# Patient Record
Sex: Female | Born: 1960 | Race: White | Hispanic: No | Smoking: Never smoker
Health system: Southern US, Community
[De-identification: ages and names within clinical notes are randomized; demographics above are authoritative.]

## PROBLEM LIST (undated history)

## (undated) DIAGNOSIS — F329 Major depressive disorder, single episode, unspecified: Secondary | ICD-10-CM

## (undated) DIAGNOSIS — I499 Cardiac arrhythmia, unspecified: Secondary | ICD-10-CM

## (undated) DIAGNOSIS — I509 Heart failure, unspecified: Secondary | ICD-10-CM

## (undated) DIAGNOSIS — E039 Hypothyroidism, unspecified: Secondary | ICD-10-CM

## (undated) DIAGNOSIS — I341 Nonrheumatic mitral (valve) prolapse: Secondary | ICD-10-CM

## (undated) DIAGNOSIS — I10 Essential (primary) hypertension: Secondary | ICD-10-CM

## (undated) DIAGNOSIS — F32A Depression, unspecified: Secondary | ICD-10-CM

## (undated) DIAGNOSIS — F419 Anxiety disorder, unspecified: Secondary | ICD-10-CM

## (undated) DIAGNOSIS — R011 Cardiac murmur, unspecified: Secondary | ICD-10-CM

## (undated) DIAGNOSIS — I447 Left bundle-branch block, unspecified: Secondary | ICD-10-CM

## (undated) DIAGNOSIS — N200 Calculus of kidney: Secondary | ICD-10-CM

## (undated) DIAGNOSIS — E282 Polycystic ovarian syndrome: Secondary | ICD-10-CM

## (undated) HISTORY — PX: TONSILLECTOMY: SUR1361

## (undated) HISTORY — PX: LITHOTRIPSY: SUR834

## (undated) HISTORY — PX: HERNIA REPAIR: SHX51

## (undated) HISTORY — PX: DILATION AND CURETTAGE OF UTERUS: SHX78

## (undated) HISTORY — PX: CHOLECYSTECTOMY: SHX55

---

## 2008-03-19 ENCOUNTER — Encounter: Admission: RE | Admit: 2008-03-19 | Discharge: 2008-03-19 | Payer: Self-pay | Admitting: Endocrinology

## 2009-05-25 ENCOUNTER — Emergency Department (HOSPITAL_COMMUNITY): Admission: EM | Admit: 2009-05-25 | Discharge: 2009-05-25 | Payer: Self-pay | Admitting: Emergency Medicine

## 2009-07-18 ENCOUNTER — Ambulatory Visit (HOSPITAL_COMMUNITY): Admission: RE | Admit: 2009-07-18 | Discharge: 2009-07-18 | Payer: Self-pay | Admitting: Endocrinology

## 2010-02-21 ENCOUNTER — Encounter: Admission: RE | Admit: 2010-02-21 | Discharge: 2010-02-21 | Payer: Self-pay | Admitting: Gynecology

## 2010-02-28 ENCOUNTER — Encounter: Admission: RE | Admit: 2010-02-28 | Discharge: 2010-02-28 | Payer: Self-pay | Admitting: Gynecology

## 2010-12-21 ENCOUNTER — Encounter: Payer: Self-pay | Admitting: Gynecology

## 2011-01-30 ENCOUNTER — Other Ambulatory Visit: Payer: Self-pay | Admitting: Endocrinology

## 2011-01-30 DIAGNOSIS — R1011 Right upper quadrant pain: Secondary | ICD-10-CM

## 2011-02-01 ENCOUNTER — Ambulatory Visit (HOSPITAL_COMMUNITY)
Admission: RE | Admit: 2011-02-01 | Discharge: 2011-02-01 | Disposition: A | Discharge status: Home / Self Care | Payer: 59 | Source: Ambulatory Visit | Attending: Endocrinology | Admitting: Endocrinology

## 2011-02-01 DIAGNOSIS — R112 Nausea with vomiting, unspecified: Secondary | ICD-10-CM | POA: Insufficient documentation

## 2011-02-01 DIAGNOSIS — R1011 Right upper quadrant pain: Secondary | ICD-10-CM | POA: Insufficient documentation

## 2011-02-01 DIAGNOSIS — K8020 Calculus of gallbladder without cholecystitis without obstruction: Secondary | ICD-10-CM | POA: Insufficient documentation

## 2011-02-25 ENCOUNTER — Other Ambulatory Visit: Payer: Self-pay | Admitting: Gynecology

## 2011-02-25 DIAGNOSIS — Z1231 Encounter for screening mammogram for malignant neoplasm of breast: Secondary | ICD-10-CM

## 2011-03-09 LAB — DIFFERENTIAL
Basophils Absolute: 0 10*3/uL (ref 0.0–0.1)
Eosinophils Absolute: 0.1 10*3/uL (ref 0.0–0.7)
Eosinophils Relative: 0 % (ref 0–5)
Lymphocytes Relative: 12 % (ref 12–46)
Lymphs Abs: 1.7 10*3/uL (ref 0.7–4.0)
Monocytes Absolute: 1.1 10*3/uL — ABNORMAL HIGH (ref 0.1–1.0)

## 2011-03-09 LAB — CBC
HCT: 34.4 % — ABNORMAL LOW (ref 36.0–46.0)
MCV: 84.4 fL (ref 78.0–100.0)
Platelets: 311 10*3/uL (ref 150–400)
RDW: 13.5 % (ref 11.5–15.5)

## 2011-03-09 LAB — URINALYSIS, ROUTINE W REFLEX MICROSCOPIC
Glucose, UA: NEGATIVE mg/dL
Nitrite: NEGATIVE
Protein, ur: 30 mg/dL — AB
pH: 6 (ref 5.0–8.0)

## 2011-03-09 LAB — URINE CULTURE

## 2011-03-09 LAB — URINE MICROSCOPIC-ADD ON

## 2011-03-09 LAB — COMPREHENSIVE METABOLIC PANEL
Albumin: 2.7 g/dL — ABNORMAL LOW (ref 3.5–5.2)
Calcium: 8.5 mg/dL (ref 8.4–10.5)
Chloride: 99 mEq/L (ref 96–112)
Creatinine, Ser: 0.68 mg/dL (ref 0.4–1.2)
Potassium: 3.4 mEq/L — ABNORMAL LOW (ref 3.5–5.1)
Total Bilirubin: 0.3 mg/dL (ref 0.3–1.2)

## 2011-03-13 ENCOUNTER — Ambulatory Visit
Admission: RE | Admit: 2011-03-13 | Discharge: 2011-03-13 | Disposition: A | Discharge status: Home / Self Care | Payer: 59 | Source: Ambulatory Visit | Attending: Gynecology | Admitting: Gynecology

## 2011-03-13 DIAGNOSIS — Z1231 Encounter for screening mammogram for malignant neoplasm of breast: Secondary | ICD-10-CM

## 2011-03-17 ENCOUNTER — Other Ambulatory Visit: Payer: Self-pay | Admitting: Gynecology

## 2011-03-17 DIAGNOSIS — R928 Other abnormal and inconclusive findings on diagnostic imaging of breast: Secondary | ICD-10-CM

## 2011-04-03 ENCOUNTER — Ambulatory Visit
Admission: RE | Admit: 2011-04-03 | Discharge: 2011-04-03 | Disposition: A | Discharge status: Home / Self Care | Payer: 59 | Source: Ambulatory Visit | Attending: Gynecology | Admitting: Gynecology

## 2011-04-03 ENCOUNTER — Encounter (HOSPITAL_COMMUNITY)
Admission: RE | Admit: 2011-04-03 | Discharge: 2011-04-03 | Disposition: A | Discharge status: Home / Self Care | Payer: 59 | Source: Ambulatory Visit | Attending: Surgery | Admitting: Surgery

## 2011-04-03 DIAGNOSIS — R928 Other abnormal and inconclusive findings on diagnostic imaging of breast: Secondary | ICD-10-CM

## 2011-04-03 LAB — URINALYSIS, ROUTINE W REFLEX MICROSCOPIC
Bilirubin Urine: NEGATIVE
Ketones, ur: 15 mg/dL — AB
Protein, ur: NEGATIVE mg/dL
Specific Gravity, Urine: 1.027 (ref 1.005–1.030)
Urobilinogen, UA: 0.2 mg/dL (ref 0.0–1.0)
pH: 5 (ref 5.0–8.0)

## 2011-04-03 LAB — COMPREHENSIVE METABOLIC PANEL
Albumin: 3.1 g/dL — ABNORMAL LOW (ref 3.5–5.2)
BUN: 14 mg/dL (ref 6–23)
Potassium: 3.5 mEq/L (ref 3.5–5.1)
Total Protein: 7.1 g/dL (ref 6.0–8.3)

## 2011-04-03 LAB — CBC
MCHC: 33.6 g/dL (ref 30.0–36.0)
Platelets: 331 10*3/uL (ref 150–400)
RDW: 14.2 % (ref 11.5–15.5)

## 2011-04-03 LAB — URINE MICROSCOPIC-ADD ON

## 2011-04-03 LAB — HCG, SERUM, QUALITATIVE: Preg, Serum: NEGATIVE

## 2011-04-03 LAB — LIPASE, BLOOD: Lipase: 40 U/L (ref 11–59)

## 2011-04-06 ENCOUNTER — Ambulatory Visit (HOSPITAL_COMMUNITY): Payer: 59

## 2011-04-06 ENCOUNTER — Other Ambulatory Visit: Payer: Self-pay | Admitting: Surgery

## 2011-04-06 ENCOUNTER — Ambulatory Visit (HOSPITAL_COMMUNITY)
Admission: RE | Admit: 2011-04-06 | Discharge: 2011-04-06 | Disposition: A | Discharge status: Home / Self Care | Payer: 59 | Source: Ambulatory Visit | Attending: Surgery | Admitting: Surgery

## 2011-04-06 ENCOUNTER — Encounter (INDEPENDENT_AMBULATORY_CARE_PROVIDER_SITE_OTHER): Payer: Self-pay | Admitting: Surgery

## 2011-04-06 DIAGNOSIS — Z01810 Encounter for preprocedural cardiovascular examination: Secondary | ICD-10-CM | POA: Insufficient documentation

## 2011-04-06 DIAGNOSIS — E119 Type 2 diabetes mellitus without complications: Secondary | ICD-10-CM | POA: Insufficient documentation

## 2011-04-06 DIAGNOSIS — K8010 Calculus of gallbladder with chronic cholecystitis without obstruction: Secondary | ICD-10-CM | POA: Insufficient documentation

## 2011-04-06 DIAGNOSIS — I059 Rheumatic mitral valve disease, unspecified: Secondary | ICD-10-CM | POA: Insufficient documentation

## 2011-04-06 DIAGNOSIS — Z01812 Encounter for preprocedural laboratory examination: Secondary | ICD-10-CM | POA: Insufficient documentation

## 2011-04-06 LAB — GLUCOSE, CAPILLARY
Glucose-Capillary: 216 mg/dL — ABNORMAL HIGH (ref 70–99)
Glucose-Capillary: 183 mg/dL — ABNORMAL HIGH (ref 70–99)

## 2011-04-09 NOTE — Op Note (Signed)
NAMECALLIE, [name]                ACCOUNT NO.:  [id number]  MEDICAL RECORD NO.:  [id number]           PATIENT TYPE:  O  LOCATION:  [facility]                         FACILITY:  [facility]  PHYSICIAN:  Currie Paris, M.D.DATE OF BIRTH:  August 04, 1961  DATE OF PROCEDURE:  04/06/2011 DATE OF DISCHARGE:                              OPERATIVE REPORT   PREOPERATIVE DIAGNOSIS:  Chronic calculous cholecystitis.  POSTOPERATIVE DIAGNOSIS:  Chronic calculous cholecystitis.  PROCEDURE:  Laparoscopic cholecystectomy with cholangiogram.  SURGEON:  Currie Paris, MD  ASSISTANT:  Gabrielle Dare. Janee Morn, MD  ANESTHESIA:  General endotracheal.  CLINICAL HISTORY:  This is a 50 year old lady with known gallstone and biliary symptoms.  After discussion with the patient, she elected to proceed to cholecystectomy.  DESCRIPTION OF PROCEDURE:  I saw the patient in the holding area, she had no further questions.  We confirmed the procedure as noted above.  The patient was taken to the operating room and after satisfactory general anesthesia had been obtained the abdomen was prepped and draped and the time-out was done.  I used 0.25% plain Marcaine for each incision.  The umbilical incision was made, the fascia was identified, and the peroneal cavity entered under direct vision.  This pursestring was placed and Hasson introduced.  Of note was the fact that the patient was concerned she might have recurrent umbilical hernia which she had had repaired initially at age 56.  However, no hernia was detected on entering the abdomen.  The abdomen was insufflated at 15 and the patient tilted to the left and reverse Trendelenburg.  Under direct vision, a 10/11 trocar was placed in the epigastrium and two 5s laterally.  The gallbladder was visualized and there were no adhesions.  It was retracted over the liver.  There was a fair amount of fatty tissue around the cystic duct area, but I was able to clear  those off and free up the gallbladder anteriorly and posteriorly, make a nice large window, see a long segment of cystic artery and cystic duct with a "critical view."  I put a clip on the artery and one of the cystic duct by the gallbladder.  The cystic duct was opened and a Cook catheter was placed percutaneously for operative angiography.  Cholangiogram was normal, although there did appear to be an aberrant right hepatic duct coming into the common duct just above the cystic duct.  The catheter was removed and I clearly identified what we had already had the cystic duct freed up to be sure we did not injury the hepatic duct with those on the cholangiogram and I put 3 clips on the cystic duct and divided it.  Cystic artery had 2 additional clips placed and it was well up on the artery and it was divided.  A posterior branch coming up was also clipped and divided.  The gallbladder was removed from below to above.  It was very thin walled and there was still some bile which was irrigated out at the end of the case.  Once we had the gallbladder disconnected, I placed it in a bag.  We used  about a liter and half of saline to irrigate it and make sure there was no bleeding, bile leaks, or residual bile in the abdomen.  The gallbladder was then removed through the umbilical port after placing it in a bag.  We re-insufflated it and made a final check and then removed the lateral ports.  The umbilical site was closed with a pursestring and appeared to close well.  The abdomen was deflated through the epigastric port.  Skin was closed with 4-0 Monocryl subcuticular plus Dermabond.  The patient tolerated the procedure well.  There were no complications. All counts were correct.     Currie Paris, M.D.     CJS/MEDQ  D:  04/06/2011  T:  04/06/2011  Job:  [id number]  Electronically Signed by Cyndia Bent M.D. on 04/09/2011 05:27:36 PM

## 2011-07-22 ENCOUNTER — Other Ambulatory Visit: Payer: Self-pay | Admitting: Gynecology

## 2011-10-09 ENCOUNTER — Other Ambulatory Visit: Payer: Self-pay | Admitting: Gynecology

## 2011-10-09 DIAGNOSIS — N63 Unspecified lump in unspecified breast: Secondary | ICD-10-CM

## 2011-10-19 ENCOUNTER — Other Ambulatory Visit: Payer: Self-pay | Admitting: Gynecology

## 2011-10-19 ENCOUNTER — Ambulatory Visit
Admission: RE | Admit: 2011-10-19 | Discharge: 2011-10-19 | Disposition: A | Discharge status: Home / Self Care | Payer: 59 | Source: Ambulatory Visit | Attending: Gynecology | Admitting: Gynecology

## 2011-10-19 DIAGNOSIS — N63 Unspecified lump in unspecified breast: Secondary | ICD-10-CM

## 2011-10-26 ENCOUNTER — Other Ambulatory Visit: Payer: Self-pay | Admitting: Gynecology

## 2011-10-26 DIAGNOSIS — N63 Unspecified lump in unspecified breast: Secondary | ICD-10-CM

## 2011-10-28 ENCOUNTER — Ambulatory Visit (HOSPITAL_COMMUNITY)
Admission: RE | Admit: 2011-10-28 | Discharge: 2011-10-28 | Disposition: A | Discharge status: Home / Self Care | Payer: 59 | Source: Ambulatory Visit | Attending: Gynecology | Admitting: Gynecology

## 2011-10-28 DIAGNOSIS — R928 Other abnormal and inconclusive findings on diagnostic imaging of breast: Secondary | ICD-10-CM | POA: Insufficient documentation

## 2011-10-28 DIAGNOSIS — N63 Unspecified lump in unspecified breast: Secondary | ICD-10-CM | POA: Insufficient documentation

## 2011-10-28 DIAGNOSIS — N6009 Solitary cyst of unspecified breast: Secondary | ICD-10-CM | POA: Insufficient documentation

## 2011-10-28 MED ORDER — GADOBENATE DIMEGLUMINE 529 MG/ML IV SOLN
20.0000 mL | Freq: Once | INTRAVENOUS | Status: AC | PRN
  Administered 2011-10-28: 20 mL via INTRAVENOUS

## 2011-10-30 ENCOUNTER — Other Ambulatory Visit: Payer: Self-pay | Admitting: Gynecology

## 2011-10-30 DIAGNOSIS — R928 Other abnormal and inconclusive findings on diagnostic imaging of breast: Secondary | ICD-10-CM

## 2011-11-04 ENCOUNTER — Other Ambulatory Visit: Payer: Self-pay | Admitting: Diagnostic Radiology

## 2011-11-05 ENCOUNTER — Ambulatory Visit
Admission: RE | Admit: 2011-11-05 | Discharge: 2011-11-05 | Disposition: A | Discharge status: Home / Self Care | Payer: 59 | Source: Ambulatory Visit | Attending: Gynecology | Admitting: Gynecology

## 2011-11-05 ENCOUNTER — Other Ambulatory Visit: Payer: Self-pay | Admitting: Diagnostic Radiology

## 2011-11-05 DIAGNOSIS — N63 Unspecified lump in unspecified breast: Secondary | ICD-10-CM

## 2011-11-05 DIAGNOSIS — R928 Other abnormal and inconclusive findings on diagnostic imaging of breast: Secondary | ICD-10-CM

## 2011-11-05 MED ORDER — GADOBENATE DIMEGLUMINE 529 MG/ML IV SOLN: 20.0000 mL | Freq: Once | INTRAVENOUS | Status: DC | PRN

## 2012-01-18 ENCOUNTER — Ambulatory Visit: Payer: 59

## 2012-02-20 ENCOUNTER — Encounter (HOSPITAL_COMMUNITY): Payer: Self-pay | Admitting: Emergency Medicine

## 2012-02-20 ENCOUNTER — Other Ambulatory Visit: Payer: Self-pay

## 2012-02-20 ENCOUNTER — Inpatient Hospital Stay (HOSPITAL_COMMUNITY)
Admission: EM | Admit: 2012-02-20 | Discharge: 2012-02-23 | DRG: 291 | Disposition: A | Discharge status: Home / Self Care | Payer: 59 | Attending: Internal Medicine | Admitting: Internal Medicine

## 2012-02-20 ENCOUNTER — Emergency Department (HOSPITAL_COMMUNITY): Payer: 59

## 2012-02-20 ENCOUNTER — Emergency Department (HOSPITAL_COMMUNITY)
Admission: EM | Admit: 2012-02-20 | Discharge: 2012-02-20 | Disposition: A | Discharge status: Home / Self Care | Payer: 59 | Source: Home / Self Care | Attending: Emergency Medicine | Admitting: Emergency Medicine

## 2012-02-20 DIAGNOSIS — I059 Rheumatic mitral valve disease, unspecified: Secondary | ICD-10-CM | POA: Diagnosis present

## 2012-02-20 DIAGNOSIS — F329 Major depressive disorder, single episode, unspecified: Secondary | ICD-10-CM | POA: Diagnosis present

## 2012-02-20 DIAGNOSIS — R0989 Other specified symptoms and signs involving the circulatory and respiratory systems: Secondary | ICD-10-CM

## 2012-02-20 DIAGNOSIS — I5021 Acute systolic (congestive) heart failure: Principal | ICD-10-CM

## 2012-02-20 DIAGNOSIS — E669 Obesity, unspecified: Secondary | ICD-10-CM

## 2012-02-20 DIAGNOSIS — I509 Heart failure, unspecified: Secondary | ICD-10-CM | POA: Diagnosis present

## 2012-02-20 DIAGNOSIS — E119 Type 2 diabetes mellitus without complications: Secondary | ICD-10-CM

## 2012-02-20 DIAGNOSIS — Z6833 Body mass index (BMI) 33.0-33.9, adult: Secondary | ICD-10-CM

## 2012-02-20 DIAGNOSIS — R0600 Dyspnea, unspecified: Secondary | ICD-10-CM

## 2012-02-20 DIAGNOSIS — R000 Tachycardia, unspecified: Secondary | ICD-10-CM

## 2012-02-20 DIAGNOSIS — F411 Generalized anxiety disorder: Secondary | ICD-10-CM | POA: Diagnosis present

## 2012-02-20 DIAGNOSIS — Z886 Allergy status to analgesic agent status: Secondary | ICD-10-CM

## 2012-02-20 DIAGNOSIS — Z888 Allergy status to other drugs, medicaments and biological substances status: Secondary | ICD-10-CM

## 2012-02-20 DIAGNOSIS — I447 Left bundle-branch block, unspecified: Secondary | ICD-10-CM | POA: Diagnosis present

## 2012-02-20 DIAGNOSIS — F3289 Other specified depressive episodes: Secondary | ICD-10-CM | POA: Diagnosis present

## 2012-02-20 DIAGNOSIS — J9600 Acute respiratory failure, unspecified whether with hypoxia or hypercapnia: Secondary | ICD-10-CM

## 2012-02-20 DIAGNOSIS — I428 Other cardiomyopathies: Secondary | ICD-10-CM | POA: Diagnosis present

## 2012-02-20 HISTORY — DX: Nonrheumatic mitral (valve) prolapse: I34.1

## 2012-02-20 HISTORY — DX: Polycystic ovarian syndrome: E28.2

## 2012-02-20 HISTORY — DX: Calculus of kidney: N20.0

## 2012-02-20 LAB — CBC
HCT: 36.7 % (ref 36.0–46.0)
Hemoglobin: 12.4 g/dL (ref 12.0–15.0)
MCH: 28.6 pg (ref 26.0–34.0)
MCHC: 33.8 g/dL (ref 30.0–36.0)
MCV: 84.6 fL (ref 78.0–100.0)
Platelets: 331 10*3/uL (ref 150–400)
RBC: 4.34 MIL/uL (ref 3.87–5.11)
RDW: 14.3 % (ref 11.5–15.5)
WBC: 9.4 10*3/uL (ref 4.0–10.5)

## 2012-02-20 LAB — RAPID URINE DRUG SCREEN, HOSP PERFORMED
Barbiturates: NOT DETECTED
Benzodiazepines: NOT DETECTED
Cocaine: NOT DETECTED
Tetrahydrocannabinol: NOT DETECTED

## 2012-02-20 LAB — DIFFERENTIAL
Eosinophils Absolute: 0.1 10*3/uL (ref 0.0–0.7)
Eosinophils Relative: 1 % (ref 0–5)
Lymphs Abs: 2.5 10*3/uL (ref 0.7–4.0)
Monocytes Absolute: 0.3 10*3/uL (ref 0.1–1.0)
Monocytes Relative: 4 % (ref 3–12)

## 2012-02-20 LAB — BASIC METABOLIC PANEL
BUN: 11 mg/dL (ref 6–23)
Calcium: 8.8 mg/dL (ref 8.4–10.5)
GFR calc non Af Amer: >90 mL/min (ref >90)
Glucose, Bld: 115 mg/dL — ABNORMAL HIGH (ref 70–99)

## 2012-02-20 LAB — CK TOTAL AND CKMB (NOT AT ARMC): Relative Index: 1.6 (ref 0.0–2.5)

## 2012-02-20 LAB — POCT I-STAT TROPONIN I: Troponin i, poc: 0.02 ng/mL (ref 0.00–0.08)

## 2012-02-20 MED ORDER — FUROSEMIDE 10 MG/ML IJ SOLN
40.0000 mg | Freq: Once | INTRAMUSCULAR | Status: AC
  Administered 2012-02-20: 40 mg via INTRAVENOUS
  Filled 2012-02-20: qty 4

## 2012-02-20 MED ORDER — IOHEXOL 350 MG/ML SOLN
100.0000 mL | Freq: Once | INTRAVENOUS | Status: AC | PRN
  Administered 2012-02-20: 100 mL via INTRAVENOUS

## 2012-02-20 MED ORDER — POTASSIUM CHLORIDE CRYS ER 20 MEQ PO TBCR
20.0000 meq | EXTENDED_RELEASE_TABLET | Freq: Two times a day (BID) | ORAL | Status: DC
  Administered 2012-02-21 – 2012-02-23 (×6): 20 meq via ORAL
  Filled 2012-02-20 (×7): qty 1

## 2012-02-20 MED ORDER — ALBUTEROL SULFATE (5 MG/ML) 0.5% IN NEBU
2.5000 mg | INHALATION_SOLUTION | Freq: Once | RESPIRATORY_TRACT | Status: AC
  Administered 2012-02-20: 2.5 mg via RESPIRATORY_TRACT

## 2012-02-20 MED ORDER — ALBUTEROL SULFATE (5 MG/ML) 0.5% IN NEBU
INHALATION_SOLUTION | RESPIRATORY_TRACT | Status: AC
  Filled 2012-02-20: qty 0.5

## 2012-02-20 MED ORDER — IPRATROPIUM BROMIDE 0.02 % IN SOLN
0.5000 mg | Freq: Once | RESPIRATORY_TRACT | Status: AC
  Administered 2012-02-20: 0.5 mg via RESPIRATORY_TRACT

## 2012-02-20 MED ORDER — ASPIRIN 325 MG PO TABS
325.0000 mg | ORAL_TABLET | Freq: Once | ORAL | Status: AC
  Administered 2012-02-20: 325 mg via ORAL
  Filled 2012-02-20: qty 1

## 2012-02-20 NOTE — ED Notes (Signed)
Cardiology at bedside.

## 2012-02-20 NOTE — ED Notes (Signed)
PA-C in room now

## 2012-02-20 NOTE — ED Provider Notes (Addendum)
History     CSN: [id number]  Arrival date & time 02/20/12  1122   First MD Initiated Contact with Patient 02/20/12 1124      Chief Complaint  Patient presents with  . URI    (Consider location/radiation/quality/duration/timing/severity/associated sxs/prior treatment) HPI Comments: Patient presents urgent care today complaining of an ongoing "shortness of breath" for about a week and getting worse. She feels very fatigued and tired and seemed to get short of breath by walking short distances which is unusual for her. Patient also described have heard some wheezing and coarse sounds but has not been expressing any consistent respiratory symptoms such as, congestion, no runny nose, no fevers.   Should also denies any gastrointestinal symptoms such as nausea or vomiting.  Patient is a 51 y.o. female presenting with URI. The history is provided by the patient. No language interpreter was used.  URI The primary symptoms include fatigue and nausea. Primary symptoms do not include fever, headaches or vomiting. The current episode started more than 1 week ago. This is a new problem.  The illness is not associated with chills, plugged ear sensation, facial pain, sinus pressure, congestion or rhinorrhea.    Past Medical History  Diagnosis Date  . Diabetes mellitus   . PCOS (polycystic ovarian syndrome)   . Mitral valve prolapse   . Kidney calculi     Past Surgical History  Procedure Date  . Hernia repair   . Cholecystectomy   . Lithotripsy     Family History  Problem Relation Age of Onset  . Arthritis Mother     RA  . Diabetes Father     Type II  . Hypertension Father     History  Substance Use Topics  . Smoking status: Never Smoker   . Smokeless tobacco: Not on file  . Alcohol Use: No    OB History    Grav Para Term Preterm Abortions TAB SAB Ect Mult Living                  Review of Systems  Constitutional: Positive for fatigue. Negative for fever, chills and  appetite change.  HENT: Negative for congestion, rhinorrhea and sinus pressure.   Respiratory: Positive for chest tightness and shortness of breath.   Cardiovascular: Negative for palpitations and leg swelling.  Gastrointestinal: Positive for nausea. Negative for vomiting.  Musculoskeletal: Negative for joint swelling.  Neurological: Negative for tremors, syncope, weakness, light-headedness and headaches.    Allergies  Demerol; Indocin; Lisinopril; Lortab; Percocet; and Vicodin  Home Medications   Current Outpatient Rx  Name Route Sig Dispense Refill  . COLESEVELAM HCL 625 MG PO TABS Oral Take 625 mg by mouth 2 (two) times daily with a meal.    . NUVARING VA Vaginal Place vaginally every 30 (thirty) days.      Marland Kitchen FLUOXETINE HCL 10 MG PO CAPS Oral Take 10 mg by mouth daily as needed. For anxiety    . IBUPROFEN 800 MG PO TABS Oral Take 800 mg by mouth every 8 (eight) hours as needed. For pain    . METFORMIN HCL ER (MOD) 1000 MG PO TB24 Oral Take 1,000 mg by mouth daily with breakfast.      . SAXAGLIPTIN-METFORMIN ER 2.03-999 MG PO TB24 Oral Take 1 tablet by mouth 2 (two) times daily.    . SERTRALINE HCL 25 MG PO TABS Oral Take 25 mg by mouth daily.      BP 151/80  Pulse 118  Temp(Src) 98.5  F (36.9 C) (Oral)  Resp 20  SpO2 97%  LMP 01/20/2012  Physical Exam  Nursing note and vitals reviewed. Constitutional: She appears well-developed and well-nourished. No distress.  HENT:  Head: Normocephalic.  Eyes: Conjunctivae are normal.  Neck: Neck supple. No JVD present.  Cardiovascular: Regular rhythm.   No extrasystoles are present. Tachycardia present.  Exam reveals no gallop, no S3, no distant heart sounds and no friction rub.   No murmur heard.  No systolic murmur is present   No diastolic murmur is present  Pulmonary/Chest: Effort normal. No accessory muscle usage. No apnea and not tachypneic. No respiratory distress. She has decreased breath sounds. She has no wheezes. She has  no rhonchi. She has no rales.  Lymphadenopathy:    She has no cervical adenopathy.    ED Course  Procedures (including critical care time)  Labs Reviewed - No data to display No results found.   No diagnosis found.    MDM  Patient chief complaint is dyspnea. On review of systems patient denies having had any recent respiratory symptoms to suggest a respiratory acute or subacute process. Patient remained somewhat tachycardic during exam it was later confirmed with EKG with some abnormalities some T. way insertion was noted in lead 3 were not present on EKG on May 2012. Given patient's symptomatology have recommended to further evaluate her dyspnea in the emergency department to exclude cardiogenic source.  Patient seemed to have improve her dyspnea with a challenge bronchodilator treatment prior nebulizer treatment patient was tachycardia ranging from 110-120.        Jimmie Molly, MD 02/20/12 1259  Jimmie Molly, MD 02/20/12 1435

## 2012-02-20 NOTE — ED Notes (Signed)
Cardiology at bedside. Pt eating food plate. Pt hr at 136. MD aware.

## 2012-02-20 NOTE — ED Notes (Signed)
Treatment in progress 

## 2012-02-20 NOTE — H&P (Signed)
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   Denies h/o known HF or CAD. Told she had MVP about 20 years ago. Also had PVCs. Has been under a lot of stress recently with her husband dying in the cath lab during an MI and her son getting married. Over past week or two has noticed progressive wheezing and DOE. Used a family member's inhaler with mild benefit. Dyspnea getting worse so came to ER. In ER found to be in sinus tach with HRs 130-140 range. BNP 726,  Chest CT. No PE. +HF.  We did bedside echo. LVEF ~25% with global HK. RV OK. Trivial MR.   Denies CP, recent fevers/chills, drug use ETOH, palpitations or syncope. No h/o thyroid disease or severe HTN.   Review of Systems:     Cardiac Review of Systems: {Y] = yes [ ]  = no  Chest Pain [    ]  Resting SOB Cove.Etienne   ] Exertional SOB  [ y ]  Pollyann Kennedy Cove.Etienne  ]   Pedal Edema [   ]    Palpitations [  ] Syncope  [  ]   Presyncope [   ]  General Review of Systems: [Y] = yes [  ]=no Constitional: recent weight change [  ]; anorexia [  ]; fatigue Cove.Etienne  ]; nausea [  ]; night sweats [  ]; fever [  ]; or chills [  ];                                                                                                                                          Dental: poor dentition[  ];   Eye : blurred vision [  ]; diplopia [   ]; vision changes [  ];  Amaurosis fugax[  ]; Resp: cough [  ];  wheezing[  ];  hemoptysis[  ]; shortness of breath[ y ]; paroxysmal nocturnal dyspnea[ y ]; dyspnea on exertion[y ]; or orthopnea[y  ];  GI:  gallstones[  ], vomiting[  ];  dysphagia[  ]; melena[  ];  hematochezia [  ]; heartburn[  ];   Hx of  Colonoscopy[  ]; GU: kidney stones [  ]; hematuria[  ];   dysuria [  ];  nocturia[  ];  history of     obstruction [  ];                 Skin: rash, swelling[  ];, hair loss[  ];  peripheral edema[  ];  or itching[   ]; Musculosketetal: myalgias[  ];  joint swelling[  ];  joint erythema[  ];  joint pain[  ];  back pain[  ];  Heme/Lymph: bruising[  ];  bleeding[  ];  anemia[  ];  Neuro:  TIA[  ];  headaches[  ];  stroke[  ];  vertigo[  ];  seizures[  ];   paresthesias[  ];  difficulty walking[  ];  Psych:depression[y  ]; anxiety[ y ];  Endocrine: diabetes[ y ];  thyroid dysfunction[  ];  Immunizations: Flu [  ]; Pneumococcal[  ];  Other:  Past Medical History  Diagnosis Date  . Diabetes mellitus   . PCOS (polycystic ovarian syndrome)   . Mitral valve prolapse   . Kidney calculi     Medications Prior to Admission  Medication Dose Route Frequency Provider Last Rate Last Dose  . albuterol (PROVENTIL) (5 MG/ML) 0.5% nebulizer solution 2.5 mg  2.5 mg Nebulization Once Jimmie Molly, MD   2.5 mg at 02/20/12 1315  . aspirin tablet 325 mg  325 mg Oral Once Cyndra Numbers, MD   325 mg at 02/20/12 1657  . furosemide (LASIX) injection 40 mg  40 mg Intravenous Once [employer], PA-C   40 mg at 02/20/12 2008  . iohexol (OMNIPAQUE) 350 MG/ML injection 100 mL  100 mL Intravenous Once PRN Medication Radiologist, MD   100 mL at 02/20/12 1752  . ipratropium (ATROVENT) nebulizer solution 0.5 mg  0.5 mg Nebulization Once Jimmie Molly, MD   0.5 mg at 02/20/12 1315   Medications Prior to Admission  Medication Sig Dispense Refill  . Etonogestrel-Ethinyl Estradiol (NUVARING VA) Place vaginally every 30 (thirty) days.        Marland Kitchen FLUoxetine (PROZAC) 10 MG capsule Take 10 mg by mouth daily as needed. For anxiety      . ibuprofen (ADVIL,MOTRIN) 800 MG tablet Take 800 mg by mouth every 8 (eight) hours as needed. For pain      . metFORMIN (GLUMETZA) 1000 MG (MOD) 24 hr tablet Take 1,000 mg by mouth daily with breakfast.           Allergies  Allergen Reactions  . Demerol     vomiting  . Indocin     High BP & CBG  . Lisinopril     LOw BP  . Lortab   . Percocet (Oxycodone-Acetaminophen)     itching  . Vicodin  (Hydrocodone-Acetaminophen)     History   Social History  . Marital Status: Widowed    Spouse Name: N/A    Number of Children: N/A  . Years of Education: N/A   Occupational History  . Not on file.   Social History Main Topics  . Smoking status: Never Smoker   . Smokeless tobacco: Not on file  . Alcohol Use: No  . Drug Use: No  . Sexually Active: Not on file   Other Topics Concern  . Not on file   Social History Narrative  . No narrative on file    Family History  Problem Relation Age of Onset  . Arthritis Mother     RA  . Diabetes Father     Type II  . Hypertension Father     PHYSICAL EXAM: Filed Vitals:   02/20/12 2215  BP: 136/76  Pulse: 129  Temp:   Resp: 12   General:  Well appearing. Sitting up in bed. No current respiratory difficulty HEENT: normal Neck: supple. JVP hard to see. Appears up to jaw.. Carotids 2+ bilat; no bruits. No lymphadenopathy or thryomegaly appreciated. Cor: PMI palpable Tachycardic Regular rate & rhythm. +s3 Lungs: clear Abdomen: obese soft, nontender, nondistended. No hepatosplenomegaly. No bruits or masses. Good bowel sounds. Extremities: no cyanosis, clubbing, rash, edema Neuro:  alert & oriented x 3, cranial nerves grossly intact. moves all 4 extremities w/o difficulty. Affect pleasant.  ECG: Initial ECG Sinus tach 115 with IVCD. F/u ECG S tach 125 with LBBB (rate-related?)  Results for orders placed during the hospital encounter of 02/20/12 (from the past 24 hour(s))  CBC     Status: Normal   Collection Time   02/20/12  4:05 PM      Component Value Range   WBC 9.4  4.0 - 10.5 (K/uL)   RBC 4.34  3.87 - 5.11 (MIL/uL)   Hemoglobin 12.4  12.0 - 15.0 (g/dL)   HCT 01.0  27.2 - 53.6 (%)   MCV 84.6  78.0 - 100.0 (fL)   MCH 28.6  26.0 - 34.0 (pg)   MCHC 33.8  30.0 - 36.0 (g/dL)   RDW 64.4  03.4 - 74.2 (%)   Platelets 331  150 - 400 (K/uL)  DIFFERENTIAL     Status: Normal   Collection Time   02/20/12  4:05 PM      Component  Value Range   Neutrophils Relative 69  43 - 77 (%)   Neutro Abs 6.5  1.7 - 7.7 (K/uL)   Lymphocytes Relative 26  12 - 46 (%)   Lymphs Abs 2.5  0.7 - 4.0 (K/uL)   Monocytes Relative 4  3 - 12 (%)   Monocytes Absolute 0.3  0.1 - 1.0 (K/uL)   Eosinophils Relative 1  0 - 5 (%)   Eosinophils Absolute 0.1  0.0 - 0.7 (K/uL)   Basophils Relative 0  0 - 1 (%)   Basophils Absolute 0.0  0.0 - 0.1 (K/uL)  BASIC METABOLIC PANEL     Status: Abnormal   Collection Time   02/20/12  4:05 PM      Component Value Range   Sodium 138  135 - 145 (mEq/L)   Potassium 3.8  3.5 - 5.1 (mEq/L)   Chloride 103  96 - 112 (mEq/L)   CO2 22  19 - 32 (mEq/L)   Glucose, Bld 115 (*) 70 - 99 (mg/dL)   BUN 11  6 - 23 (mg/dL)   Creatinine, Ser 5.95  0.50 - 1.10 (mg/dL)   Calcium 8.8  8.4 - 63.8 (mg/dL)   GFR calc non Af Amer >90  >90 (mL/min)   GFR calc Af Amer >90  >90 (mL/min)  D-DIMER, QUANTITATIVE     Status: Abnormal   Collection Time   02/20/12  4:05 PM      Component Value Range   D-Dimer, Quant 1.04 (*) 0.00 - 0.48 (ug/mL-FEU)  POCT I-STAT TROPONIN I     Status: Normal   Collection Time   02/20/12  4:22 PM      Component Value Range   Troponin i, poc 0.02  0.00 - 0.08 (ng/mL)   Comment 3           MAGNESIUM     Status: Normal   Collection Time   02/20/12  4:35 PM      Component Value Range   Magnesium 1.9  1.5 - 2.5 (mg/dL)  URINE RAPID DRUG SCREEN (HOSP PERFORMED)     Status: Normal   Collection Time   02/20/12  5:54 PM      Component Value Range   Opiates NONE DETECTED  NONE DETECTED    Cocaine NONE DETECTED  NONE DETECTED    Benzodiazepines NONE DETECTED  NONE DETECTED    Amphetamines NONE DETECTED  NONE DETECTED  Tetrahydrocannabinol NONE DETECTED  NONE DETECTED    Barbiturates NONE DETECTED  NONE DETECTED   PRO B NATRIURETIC PEPTIDE     Status: Abnormal   Collection Time   02/20/12  6:36 PM      Component Value Range   Pro B Natriuretic peptide (BNP) 726.8 (*) 0 - 125 (pg/mL)  CK TOTAL AND  CKMB     Status: Normal   Collection Time   02/20/12  6:55 PM      Component Value Range   Total CK 105  7 - 177 (U/L)   CK, MB 1.7  0.3 - 4.0 (ng/mL)   Relative Index 1.6  0.0 - 2.5    Dg Chest 2 View  02/20/2012  *RADIOLOGY REPORT*  Clinical Data: Shortness of breath and wheezing.  CHEST - 2 VIEW  Comparison: None.  Findings: There is cardiomegaly without pulmonary edema.  No focal airspace disease or pleural effusion.  Convex left thoracolumbar scoliosis noted.  IMPRESSION: Cardiomegaly without acute disease.  Original Report Authenticated By: Bernadene Bell. Maricela Curet, M.D.   Ct Angio Chest W/cm &/or Wo Cm  02/20/2012  *RADIOLOGY REPORT*  Clinical Data: Short of breath.  Elevated D-dimer.  Clinical suspicion for pulmonary embolism.  CT ANGIOGRAPHY CHEST  Technique:  Multidetector CT imaging of the chest using the standard protocol during bolus administration of intravenous contrast. Multiplanar reconstructed images including MIPs were obtained and reviewed to evaluate the vascular anatomy.  Contrast: OMNIPAQUE IOHEXOL 350 MG/ML IV SOLN  Comparison: None.  Findings: Satisfactory opacification of the pulmonary arteries noted, and there is no evidence of pulmonary emboli.  No evidence of thoracic aortic aneurysm or mediastinal hematoma.  No evidence of mediastinal mass or lymphadenopathy.  No adenopathy seen elsewhere within the thorax.  Mild cardiomegaly is noted.  No evidence of pleural or pericardial effusion.  No evidence of pulmonary consolidation or mass.  Diffuse pulmonary interstitial thickening and thickening of the pulmonary interlobular septi seen, consistent with interstitial edema.  IMPRESSION:  1.  No evidence of pulmonary embolism. 2.  Cardiomegaly and diffuse thickening of interstitial lung markings, consistent with mild interstitial edema/congestive heart failure.  Original Report Authenticated By: Danae Orleans, M.D.    ASSESSMENT: 1) Acute systolic HF - suspect NICM      --LVEF  approximately 25% on bedside echo 2) Acute respiratory failure - improved 3) DM2 4) Obesity 5) LBBB, new  PLAN/DISCUSSION:  Suspect she has NICM. Unclear etiology (?DM2, stress, OSA or hyperthyroidism). Currently quite tachycardic. Respiratory status much improved with 1st dose of lasix. Will start Ace-i, digoxin, low-dose spiro and IV lasix. Get echo tomorrow. Check TSH, ANA, HIV and hepatitis panels. Hopefully can start low-dose b-blocker soon. Will defer cath for now and see if she improves with medical therapy. HF team to follow. Discussed with patient and her family. Cover DM2 with SSI.   Maeve Debord,MD 11:11 PM

## 2012-02-20 NOTE — ED Notes (Signed)
Patient remains reclined, ekg is being verified/reviewed with physician.

## 2012-02-20 NOTE — ED Notes (Signed)
Pt returns from x-ray, PA at bedside, pt placed cardiac monitor, 02 2lt [place], cont. Pulse ox.

## 2012-02-20 NOTE — ED Provider Notes (Signed)
History     CSN: [id number]  Arrival date & time 02/20/12  1404   First MD Initiated Contact with Patient 02/20/12 1526      Chief Complaint  Patient presents with  . Shortness of Breath    (Consider location/radiation/quality/duration/timing/severity/associated sxs/prior treatment) HPI Comments: Patient reports that she began feeling short of breath six days ago.  Shortness of breath was associated with some wheezing.  She was seen as Urgent Care six days ago and was diagnosed with a URI and given Rx for flonase and Albuterol, which she has been using at night.  She does not feel that the Albuterol helps with her shortness of breath.  She reports that the wheezing is worse at night and then typically resolves by noon.  She denies any chest pain.  She denies any nausea or vomiting.  Denies any numbness.  No prior cardiac history aside from a MVP.  She has never had a cardiac stress test.  She does have a history of DM, but no history of HTN or Hld.  No family history of premature CAD.   She reports that she is currently on Nuva Ring.  She also reports that 7 days ago she flew back to [place] from [place].  No prior history of DVT/PE.  No swelling or pain of the lower extremities.    Patient is a 51 y.o. female presenting with shortness of breath. The history is provided by the patient.  Shortness of Breath  Associated symptoms include cough, shortness of breath and wheezing. Pertinent negatives include no chest pain and no fever.    Past Medical History  Diagnosis Date  . Diabetes mellitus   . PCOS (polycystic ovarian syndrome)   . Mitral valve prolapse   . Kidney calculi     Past Surgical History  Procedure Date  . Hernia repair   . Cholecystectomy   . Lithotripsy     Family History  Problem Relation Age of Onset  . Arthritis Mother     RA  . Diabetes Father     Type II  . Hypertension Father     History  Substance Use Topics  . Smoking status: Never Smoker   .  Smokeless tobacco: Not on file  . Alcohol Use: No    OB History    Grav Para Term Preterm Abortions TAB SAB Ect Mult Living                  Review of Systems  Constitutional: Negative for fever and chills.  Respiratory: Positive for cough, shortness of breath and wheezing. Negative for chest tightness.   Cardiovascular: Negative for chest pain.  Gastrointestinal: Negative for nausea and vomiting.  Musculoskeletal: Negative for joint swelling.  Skin: Negative for color change.  Neurological: Negative for dizziness, syncope, light-headedness and numbness.    Allergies  Demerol; Indocin; Lisinopril; Lortab; Percocet; and Vicodin  Home Medications   Current Outpatient Rx  Name Route Sig Dispense Refill  . COLESEVELAM HCL 625 MG PO TABS Oral Take 625 mg by mouth 2 (two) times daily with a meal.    . NUVARING VA Vaginal Place vaginally every 30 (thirty) days.      Marland Kitchen FLUOXETINE HCL 10 MG PO CAPS Oral Take 10 mg by mouth daily as needed. For anxiety    . IBUPROFEN 800 MG PO TABS Oral Take 800 mg by mouth every 8 (eight) hours as needed. For pain    . METFORMIN HCL ER (MOD) 1000 MG  PO TB24 Oral Take 1,000 mg by mouth daily with breakfast.      . SAXAGLIPTIN-METFORMIN ER 2.03-999 MG PO TB24 Oral Take 1 tablet by mouth 2 (two) times daily.    . SERTRALINE HCL 25 MG PO TABS Oral Take 25 mg by mouth daily.      BP 149/98  Pulse 121  Temp(Src) 97.9 F (36.6 C) (Oral)  Resp 20  SpO2 97%  LMP 01/20/2012  Physical Exam  Nursing note and vitals reviewed. Constitutional: She is oriented to person, place, and time. She appears well-developed and well-nourished. No distress.  HENT:  Head: Normocephalic and atraumatic.  Mouth/Throat: Oropharynx is clear and moist.  Neck: Normal range of motion.  Cardiovascular: Normal heart sounds.  Tachycardia present.   Pulmonary/Chest: Effort normal and breath sounds normal. No respiratory distress. She has no wheezes. She has no rales. She exhibits  no tenderness.  Abdominal: Soft. Bowel sounds are normal. She exhibits no distension and no mass. There is no tenderness.  Musculoskeletal: She exhibits no edema.       No lower extremity swelling bilaterally. Negative Homan's sign bilaterally  Neurological: She is alert and oriented to person, place, and time.  Skin: Skin is warm and dry. She is not diaphoretic. No erythema.       No erythema of the lower extremities  Psychiatric: She has a normal mood and affect.    ED Course  Procedures (including critical care time)  Labs Reviewed  BASIC METABOLIC PANEL - Abnormal; Notable for the following:    Glucose, Bld 115 (*)    All other components within normal limits  D-DIMER, QUANTITATIVE - Abnormal; Notable for the following:    D-Dimer, Quant 1.04 (*)    All other components within normal limits  CBC  DIFFERENTIAL  POCT I-STAT TROPONIN I  MAGNESIUM  URINE RAPID DRUG SCREEN (HOSP PERFORMED)   No results found.   No diagnosis found.  7:52 PM Discussed with  Cardiology.  They report that they will come see the patient in the ED.   Date: 02/22/2012  Rate: 125 bpm  Rhythm: sinus tachycardia  QRS Axis: normal  Intervals: normal  ST/T Wave abnormalities: nonspecific ST/T changes  Conduction Disutrbances:none  Narrative Interpretation: LBBB  Old EKG Reviewed: changes noted   MDM  Patient comes in today with a chief complaint of shortness of breath that has been present for the past week and tachycardia.  She reported travel from [place] one week ago.  Therefore, CTA was ordered to rule out PE.  CTA negative for PE but did show changes consistent with CHF.  Patient with BNP of 726.  No prior history of Heart Failure.  Patient given dose of IV Lasix.   Cardiology consulted and will admit patient.        Pascal Lux [place], PA-C 02/22/12 [phone]

## 2012-02-20 NOTE — ED Notes (Signed)
Pt heart rate 130's post treatment dr Ladon Applebaum notified - pt tearful - hold transfer at this time

## 2012-02-20 NOTE — ED Provider Notes (Signed)
Patient is a Engineer, civil (consulting) at [employer]. Reports one week ago she started getting shortness of breath and having dyspnea on exertion with wheezing. She denies respiratory symptoms, fever, chest pain. She also has not had swelling of her legs.  Pt is alert and cooperative. No pitting of edema in legs. Lungs show diminished breath sounds, no rales or wheezing at this time.  Hx, EKG and labs c/w cardiomyopathy.   Plan admission  Medical screening examination/treatment/procedure(s) were conducted as a shared visit with non-physician practitioner(s) and myself.  I personally evaluated the patient during the encounter Devoria Albe, MD, Franz Dell, MD 02/20/12 [phone]

## 2012-02-20 NOTE — ED Provider Notes (Signed)
Patient is a 51 year old female who presents today after being seen in urgent care for ongoing difficulties with wheezing. Patient had no history of this or shortness of breath until the past week or so. She denies chest pain at this time. There were reportedly EKG changes at urgent care. Patient is chest pain-free at this time. She denies cough or fevers. In recent other medical visits she's been treated with Flonase and albuterol which has not helped her symptoms. She was given an albuterol treatment this morning. She does describe feeling like her heart is racing at this time.  Cardiovascular: Tachycardic with regular rhythm. No murmurs rubs or gallops. Pulmonary: Equal breath sounds bilaterally. No wheezes appreciated. Slightly diminished.  Assessment: 51 year old female who presents today with ongoing difficulties with shortness of breath and reported wheezing.  Plan: Laboratory workup as well as imaging ordered. This does include troponin as well as d-dimer. Patient did have aspirin ordered here given her report of EKG changes. EKG has been ordered as well.  Cyndra Numbers, MD 02/20/12 [phone]

## 2012-02-20 NOTE — ED Notes (Signed)
Pt ambulates to bathroom. States feeling much better. Waiting for room assignment.

## 2012-02-20 NOTE — ED Notes (Signed)
Error. Wrong pt. Discharged. Need for discharge dose not apply at this time.

## 2012-02-20 NOTE — ED Notes (Signed)
Pt ambulates to restroom c/o of exertional sob. spo2 93% on RA

## 2012-02-20 NOTE — ED Notes (Addendum)
Onset last Saturday, woke with wheezing ( never wheezed before).  Patient was out of town [place] Bane), returned home Koleen Nimrod) on Saturday night.  Patient was in [place] 3 days, patient flew on this trip.  Patient notices wheezing at the end of the day.  Reports wheezing all night. Reports wheezing in am, by afternoon feels better.  Patient has also spent time in [place] this past week.  Seen by an urgent care in [place], dx with allergic rhinitis, treated with flonase and albuteral.  Denies fever .  Denies runny nose.  Denies ear or sore throat pain.  Wheezing is intermittent, unassociated.  Breathing worsens with activity.

## 2012-02-20 NOTE — ED Notes (Signed)
Patient is very emotional, anxious.  Patient's spouse died suddenly in 08-19-11.

## 2012-02-20 NOTE — ED Notes (Signed)
Pt. Stated, I'm here from [facility] I'm to have an Echo per UC Dr.  Peggye Form been having SOB and wheezing for a week.

## 2012-02-20 NOTE — ED Notes (Addendum)
DR.I.Knapp to eval ecg.

## 2012-02-20 NOTE — ED Notes (Signed)
Heart rate 117 - 118 to ed via shuttle

## 2012-02-21 ENCOUNTER — Encounter (HOSPITAL_COMMUNITY): Payer: Self-pay

## 2012-02-21 DIAGNOSIS — I059 Rheumatic mitral valve disease, unspecified: Secondary | ICD-10-CM

## 2012-02-21 DIAGNOSIS — I5021 Acute systolic (congestive) heart failure: Principal | ICD-10-CM

## 2012-02-21 LAB — HEMOGLOBIN A1C: Mean Plasma Glucose: 134 mg/dL — ABNORMAL HIGH (ref <117)

## 2012-02-21 LAB — URINALYSIS, ROUTINE W REFLEX MICROSCOPIC
Hgb urine dipstick: NEGATIVE
Leukocytes, UA: NEGATIVE
Nitrite: NEGATIVE
Specific Gravity, Urine: 1.034 — ABNORMAL HIGH (ref 1.005–1.030)
Urobilinogen, UA: 0.2 mg/dL (ref 0.0–1.0)

## 2012-02-21 LAB — HIV ANTIBODY (ROUTINE TESTING W REFLEX): HIV: NONREACTIVE

## 2012-02-21 LAB — CARDIAC PANEL(CRET KIN+CKTOT+MB+TROPI)
CK, MB: 1.8 ng/mL (ref 0.3–4.0)
Relative Index: 1.7 (ref 0.0–2.5)
Troponin I: <0.3 ng/mL (ref <0.30)
Troponin I: <0.3 ng/mL (ref <0.30)
Troponin I: <0.3 ng/mL (ref <0.30)

## 2012-02-21 LAB — BASIC METABOLIC PANEL
BUN: 9 mg/dL (ref 6–23)
CO2: 27 mEq/L (ref 19–32)
Chloride: 101 mEq/L (ref 96–112)
Creatinine, Ser: 0.53 mg/dL (ref 0.50–1.10)
Potassium: 3.6 mEq/L (ref 3.5–5.1)

## 2012-02-21 LAB — GLUCOSE, CAPILLARY: Glucose-Capillary: 120 mg/dL — ABNORMAL HIGH (ref 70–99)

## 2012-02-21 LAB — TSH: TSH: 6.567 u[IU]/mL — ABNORMAL HIGH (ref 0.350–4.500)

## 2012-02-21 MED ORDER — SERTRALINE HCL 25 MG PO TABS
25.0000 mg | ORAL_TABLET | Freq: Every day | ORAL | Status: DC
  Administered 2012-02-21 – 2012-02-22 (×3): 25 mg via ORAL
  Filled 2012-02-21 (×4): qty 1

## 2012-02-21 MED ORDER — CAPTOPRIL 12.5 MG PO TABS
12.5000 mg | ORAL_TABLET | Freq: Three times a day (TID) | ORAL | Status: DC
  Administered 2012-02-21 – 2012-02-22 (×3): 12.5 mg via ORAL
  Filled 2012-02-21 (×6): qty 1

## 2012-02-21 MED ORDER — SODIUM CHLORIDE 0.9 % IJ SOLN: 3.0000 mL | INTRAMUSCULAR | Status: DC | PRN

## 2012-02-21 MED ORDER — FUROSEMIDE 10 MG/ML IJ SOLN
40.0000 mg | Freq: Two times a day (BID) | INTRAMUSCULAR | Status: DC
  Administered 2012-02-21 – 2012-02-22 (×3): 40 mg via INTRAVENOUS
  Filled 2012-02-21 (×5): qty 4

## 2012-02-21 MED ORDER — SERTRALINE HCL 25 MG PO TABS: 25.0000 mg | ORAL_TABLET | Freq: Every day | ORAL | Status: DC

## 2012-02-21 MED ORDER — CAPTOPRIL 6.25 MG HALF TABLET
6.2500 mg | ORAL_TABLET | Freq: Three times a day (TID) | ORAL | Status: DC
  Administered 2012-02-21: 6.25 mg via ORAL
  Filled 2012-02-21 (×4): qty 1

## 2012-02-21 MED ORDER — SPIRONOLACTONE 12.5 MG HALF TABLET
12.5000 mg | ORAL_TABLET | Freq: Every day | ORAL | Status: DC
  Administered 2012-02-21: 12.5 mg via ORAL
  Filled 2012-02-21 (×2): qty 1

## 2012-02-21 MED ORDER — INSULIN ASPART 100 UNIT/ML ~~LOC~~ SOLN
0.0000 [IU] | Freq: Three times a day (TID) | SUBCUTANEOUS | Status: DC
Start: 2012-02-21 — End: 2012-02-23
  Administered 2012-02-22: 2 [IU] via SUBCUTANEOUS
  Administered 2012-02-22: 3 [IU] via SUBCUTANEOUS
  Administered 2012-02-23: 2 [IU] via SUBCUTANEOUS

## 2012-02-21 MED ORDER — SODIUM CHLORIDE 0.9 % IV SOLN: 250.0000 mL | INTRAVENOUS | Status: DC | PRN

## 2012-02-21 MED ORDER — ACETAMINOPHEN 325 MG PO TABS
650.0000 mg | ORAL_TABLET | ORAL | Status: DC | PRN
  Administered 2012-02-21 – 2012-02-22 (×4): 650 mg via ORAL
  Filled 2012-02-21 (×4): qty 2

## 2012-02-21 MED ORDER — DIGOXIN 250 MCG PO TABS
0.2500 mg | ORAL_TABLET | Freq: Every day | ORAL | Status: DC
  Administered 2012-02-21 – 2012-02-23 (×3): 0.25 mg via ORAL
  Filled 2012-02-21 (×3): qty 1

## 2012-02-21 MED ORDER — CARVEDILOL 3.125 MG PO TABS
3.1250 mg | ORAL_TABLET | Freq: Two times a day (BID) | ORAL | Status: DC
  Administered 2012-02-21 – 2012-02-23 (×4): 3.125 mg via ORAL
  Filled 2012-02-21 (×6): qty 1

## 2012-02-21 MED ORDER — ASPIRIN EC 81 MG PO TBEC
81.0000 mg | DELAYED_RELEASE_TABLET | Freq: Every day | ORAL | Status: DC
  Administered 2012-02-21 – 2012-02-23 (×3): 81 mg via ORAL
  Filled 2012-02-21 (×3): qty 1

## 2012-02-21 MED ORDER — SODIUM CHLORIDE 0.9 % IJ SOLN
3.0000 mL | Freq: Two times a day (BID) | INTRAMUSCULAR | Status: DC
  Administered 2012-02-21 – 2012-02-22 (×5): 3 mL via INTRAVENOUS

## 2012-02-21 MED ORDER — ONDANSETRON HCL 4 MG/2ML IJ SOLN: 4.0000 mg | Freq: Four times a day (QID) | INTRAMUSCULAR | Status: DC | PRN

## 2012-02-21 NOTE — Progress Notes (Signed)
Subjective:  Feels so much better today.  No wheezing.  No dyspnea lying flat.Rhythm NSR and sinus tachycardia. 2D echo not yet done  Objective:  Vital Signs in the last 24 hours: Temp:  [97.4 F (36.3 C)-98.5 F (36.9 C)] 97.4 F (36.3 C) (03/24 0900) Pulse Rate:  [102-132] 114  (03/24 0900) Resp:  [12-25] 18  (03/24 0900) BP: (108-151)/(67-98) 111/72 mmHg (03/24 0900) SpO2:  [95 %-97 %] 96 % (03/24 0900) Weight:  [100.381 kg (221 lb 4.8 oz)] 100.381 kg (221 lb 4.8 oz) (03/24 0112)  Intake/Output from previous day: 03/23 0701 - 03/24 0700 In: 7 [I.V.:3; IV Piggyback:4] Out: 500 [Urine:500] Intake/Output from this shift: Total I/O In: 170 [P.O.:170] Out: 1026 [Urine:1025; Stool:1]     . aspirin EC  81 mg Oral Daily  . aspirin  325 mg Oral Once  . captopril  6.25 mg Oral Q8H  . digoxin  0.25 mg Oral Daily  . furosemide  40 mg Intravenous Once  . furosemide  40 mg Intravenous Q12H  . insulin aspart  0-15 Units Subcutaneous TID WC  . potassium chloride  20 mEq Oral BID  . sertraline  25 mg Oral QHS  . sodium chloride  3 mL Intravenous Q12H  . spironolactone  12.5 mg Oral Daily  . DISCONTD: sertraline  25 mg Oral Daily      Physical Exam: The patient appears to be in no distress.  Head and neck exam reveals that the pupils are equal and reactive.  The extraocular movements are full.  There is no scleral icterus.  Mouth and pharynx are benign.  No lymphadenopathy.  No carotid bruits.  The jugular venous pressure is normal.  Thyroid is not enlarged or tender.  Chest is clear to percussion and auscultation.  No rales or rhonchi.  Expansion of the chest is symmetrical.  Heart reveals no abnormal lift or heave.  First and second heart sounds are normal.  There is a prominent S3 gallop  The abdomen is soft and nontender.  Bowel sounds are normoactive.  There is no hepatosplenomegaly or mass.  There are no abdominal bruits.  Extremities reveal no phlebitis or edema.   Pedal pulses are good.  There is no cyanosis or clubbing.  Neurologic exam is normal strength and no lateralizing weakness.  No sensory deficits.  Integument reveals no rash  Lab Results:  Basename 02/20/12 1605  WBC 9.4  HGB 12.4  PLT 331    Basename 02/21/12 0510 02/20/12 1605  NA 138 138  K 3.6 3.8  CL 101 103  CO2 27 22  GLUCOSE 141* 115*  BUN 9 11  CREATININE 0.53 0.51    Basename 02/21/12 0939 02/21/12 0149  TROPONINI <0.30 <0.30   Hepatic Function Panel No results found for this basename: PROT,ALBUMIN,AST,ALT,ALKPHOS,BILITOT,BILIDIR,IBILI in the last 72 hours No results found for this basename: CHOL in the last 72 hours No results found for this basename: PROTIME in the last 72 hours  Imaging: Imaging results have been reviewed  Cardiac Studies:  Assessment/Plan:  Patient Active Hospital Problem List: Acute systolic heart failure (02/20/2012)   Assessment: Significant improvement.   Plan: Continue current meds. Add Coreg 3.125 mg BID to help tachycardia while awaiting echo Acute respiratory failure (02/20/2012)   Assessment: No wheezing.  DM2 (diabetes mellitus, type 2) (02/20/2012)   Assessment: Blood sugar high on admission.  History of diabetes   Plan: SSI ordered.    LOS: 1 day    Cassell Clement  02/21/2012, 11:07 AM

## 2012-02-21 NOTE — ED Notes (Signed)
Attempt to call report nurse not available 

## 2012-02-21 NOTE — Progress Notes (Signed)
  Echocardiogram 2D Echocardiogram has been performed.  Vanetta Rule, Real Cons 02/21/2012, 5:08 PM

## 2012-02-22 LAB — BASIC METABOLIC PANEL
BUN: 17 mg/dL (ref 6–23)
CO2: 24 mEq/L (ref 19–32)
Calcium: 9.5 mg/dL (ref 8.4–10.5)
Calcium: 9.4 mg/dL (ref 8.4–10.5)
Chloride: 102 mEq/L (ref 96–112)
Chloride: 98 mEq/L (ref 96–112)
Creatinine, Ser: 0.63 mg/dL (ref 0.50–1.10)
GFR calc Af Amer: >90 mL/min (ref >90)
Glucose, Bld: 146 mg/dL — ABNORMAL HIGH (ref 70–99)
Potassium: 4 mEq/L (ref 3.5–5.1)
Sodium: 137 mEq/L (ref 135–145)

## 2012-02-22 LAB — ANTI-NUCLEAR AB-TITER (ANA TITER): ANA Titer 1: NEGATIVE

## 2012-02-22 LAB — GLUCOSE, CAPILLARY
Glucose-Capillary: 152 mg/dL — ABNORMAL HIGH (ref 70–99)
Glucose-Capillary: 183 mg/dL — ABNORMAL HIGH (ref 70–99)
Glucose-Capillary: 151 mg/dL — ABNORMAL HIGH (ref 70–99)

## 2012-02-22 LAB — ANA: Anti Nuclear Antibody(ANA): POSITIVE — AB

## 2012-02-22 LAB — HEPATITIS PANEL, ACUTE: Hepatitis B Surface Ag: NEGATIVE

## 2012-02-22 LAB — T4, FREE: Free T4: 1.19 ng/dL (ref 0.80–1.80)

## 2012-02-22 MED ORDER — FUROSEMIDE 40 MG PO TABS
40.0000 mg | ORAL_TABLET | Freq: Every day | ORAL | Status: DC
  Administered 2012-02-23: 40 mg via ORAL
  Filled 2012-02-22: qty 1

## 2012-02-22 MED ORDER — SPIRONOLACTONE 25 MG PO TABS
25.0000 mg | ORAL_TABLET | Freq: Every day | ORAL | Status: DC
  Administered 2012-02-22 – 2012-02-23 (×2): 25 mg via ORAL
  Filled 2012-02-22 (×2): qty 1

## 2012-02-22 MED ORDER — ENALAPRIL MALEATE 10 MG PO TABS
10.0000 mg | ORAL_TABLET | Freq: Two times a day (BID) | ORAL | Status: DC
  Administered 2012-02-22 – 2012-02-23 (×2): 10 mg via ORAL
  Filled 2012-02-22 (×5): qty 1

## 2012-02-22 NOTE — Progress Notes (Signed)
02/22/12 1630 UR Completed. Tera Mater, RN, BSN Nurse Case Manager [phone]

## 2012-02-22 NOTE — Progress Notes (Signed)
Pt evaluated for progression of care as a benefit of [employer] insurance. Pt is newly active in the Link to Wellness program for DM and HTN. She is interested in cardiac rehab and can participate at the [facility] location at the time it is appropriate, as this location is closer to her home. She will receive monthly office visits with the [facility] RN Case manager as a part of the Link to Wellness program. A post d/c transition of care call will be completed and office visit scheduled at that time. Pt has contact information if any other needs arise.  Brooke Bonito C. Roena Malady, RN, CCM, [facility] Care Management Program, # [phone].

## 2012-02-22 NOTE — Progress Notes (Signed)
Subjective:   Continues to feel better. I/O negative 1.6L but weight stable. Ambulating with dyspnea. No orthopnea or PND. Formal echo not read yet. TSH mildly elevated at 6.6.    Intake/Output Summary (Last 24 hours) at 02/22/12 0802 Last data filed at 02/22/12 0715  Gross per 24 hour  Intake    721 ml  Output   2426 ml  Net  -1705 ml    Current meds:    . aspirin EC  81 mg Oral Daily  . captopril  12.5 mg Oral Q8H  . carvedilol  3.125 mg Oral BID WC  . digoxin  0.25 mg Oral Daily  . furosemide  40 mg Intravenous Q12H  . insulin aspart  0-15 Units Subcutaneous TID WC  . potassium chloride  20 mEq Oral BID  . sertraline  25 mg Oral QHS  . sodium chloride  3 mL Intravenous Q12H  . spironolactone  12.5 mg Oral Daily  . DISCONTD: captopril  6.25 mg Oral Q8H   Infusions:     Objective:  Blood pressure 121/81, pulse 92, temperature 98.7 F (37.1 C), temperature source Oral, resp. rate 18, height 5\' 8"  (1.727 m), weight 100.517 kg (221 lb 9.6 oz), last menstrual period 01/20/2012, SpO2 96.00%. Weight change: 0.136 kg (4.8 oz)  Physical Exam: General:  Well appearing. No resp difficulty HEENT: normal Neck: supple. JVP flat . Carotids 2+ bilat; no bruits. No lymphadenopathy or thryomegaly appreciated. Cor: PMI  displaced. Regular rate & rhythm. No rubs, gallops or murmurs. Lungs: clear Abdomen: soft, nontender, nondistended. No hepatosplenomegaly. No bruits or masses. Good bowel sounds. Extremities: no cyanosis, clubbing, rash, edema Neuro: alert & orientedx3, cranial nerves grossly intact. moves all 4 extremities w/o difficulty. Affect pleasant  Telemetry: Sinus 80s overnight. 120 with mild exertion.   Lab Results: Basic Metabolic Panel:  Lab 02/22/12 4098 02/21/12 0510 02/20/12 1635 02/20/12 1605  NA 137 138 -- 138  K 4.0 3.6 -- --  CL 102 101 -- 103  CO2 24 27 -- 22  GLUCOSE 146* 141* -- 115*  BUN 18 9 -- 11  CREATININE 0.63 0.53 -- 0.51  CALCIUM 9.5 9.3 --  8.8  MG -- -- 1.9 --  PHOS -- -- -- --   Liver Function Tests: No results found for this basename: AST:5,ALT:5,ALKPHOS:5,BILITOT:5,PROT:5,ALBUMIN:5 in the last 168 hours No results found for this basename: LIPASE:5,AMYLASE:5 in the last 168 hours No results found for this basename: AMMONIA:5 in the last 168 hours CBC:  Lab 02/20/12 1605  WBC 9.4  NEUTROABS 6.5  HGB 12.4  HCT 36.7  MCV 84.6  PLT 331   Cardiac Enzymes:  Lab 02/21/12 1728 02/21/12 0939 02/21/12 0149 02/20/12 1855  CKTOTAL 118 101 103 105  CKMB 1.4 1.7 1.8 1.7  CKMBINDEX -- -- -- --  TROPONINI <0.30 <0.30 <0.30 --   BNP: No components found with this basename: POCBNP:5 CBG:  Lab 02/21/12 2002 02/21/12 1714  GLUCAP 152* 120*   Microbiology: Lab Results  Component Value Date   CULT Multiple bacterial morphotypes present, none predominant. Suggest appropriate recollection if clinically indicated. 05/25/2009   No results found for this basename: CULT:2,SDES:2 in the last 168 hours  Imaging: Dg Chest 2 View  02/20/2012  *RADIOLOGY REPORT*  Clinical Data: Shortness of breath and wheezing.  CHEST - 2 VIEW  Comparison: None.  Findings: There is cardiomegaly without pulmonary edema.  No focal airspace disease or pleural effusion.  Convex left thoracolumbar scoliosis noted.  IMPRESSION: Cardiomegaly  without acute disease.  Original Report Authenticated By: Bernadene Bell. Maricela Curet, M.D.   Ct Angio Chest W/cm &/or Wo Cm  02/20/2012  *RADIOLOGY REPORT*  Clinical Data: Short of breath.  Elevated D-dimer.  Clinical suspicion for pulmonary embolism.  CT ANGIOGRAPHY CHEST  Technique:  Multidetector CT imaging of the chest using the standard protocol during bolus administration of intravenous contrast. Multiplanar reconstructed images including MIPs were obtained and reviewed to evaluate the vascular anatomy.  Contrast: OMNIPAQUE IOHEXOL 350 MG/ML IV SOLN  Comparison: None.  Findings: Satisfactory opacification of the  pulmonary arteries noted, and there is no evidence of pulmonary emboli.  No evidence of thoracic aortic aneurysm or mediastinal hematoma.  No evidence of mediastinal mass or lymphadenopathy.  No adenopathy seen elsewhere within the thorax.  Mild cardiomegaly is noted.  No evidence of pleural or pericardial effusion.  No evidence of pulmonary consolidation or mass.  Diffuse pulmonary interstitial thickening and thickening of the pulmonary interlobular septi seen, consistent with interstitial edema.  IMPRESSION:  1.  No evidence of pulmonary embolism. 2.  Cardiomegaly and diffuse thickening of interstitial lung markings, consistent with mild interstitial edema/congestive heart failure.  Original Report Authenticated By: Danae Orleans, M.D.     ASSESSMENT:  1) Acute systolic HF - suspect NICM  --LVEF approximately 25% on bedside echo  2) Acute respiratory failure - improved  3) DM2  4) Obesity  5) LBBB, new   PLAN/DISCUSSION:  Feels much better. Volume status looks good. Will consolidate meds and change lasix to po. Will review echo. Given high TSH will check T4 and T3 - may need low dose synthroid. Probable d/c in am.   LOS: 2 days    Arvilla Meres, MD 02/22/2012, 8:02 AM

## 2012-02-22 NOTE — Plan of Care (Signed)
Problem: Food- and Nutrition-Related Knowledge Deficit (NB-1.1) Goal: Nutrition education Formal process to instruct or train a patient/client in a skill or to impart knowledge to help patients/clients voluntarily manage or modify food choices and eating behavior to maintain or improve health.  Outcome: Completed/Met Date Met:  02/22/12 RD consulted for diet education. Patient requested to see RD as she is now following a low sodium diet and follows a low carb diet at home. Based on pt diet recall patient eats very few carbs and mostly fresh vegetables. RD spoke with patient about types of foods she might be eating that would be high in sodium . Pt was provided with low sodium nutrition information. Pt agreeable to reading food labels on her commonly eaten foods and making appropriate changes. Diet: Carb control with 2 gm sodium and 1800 ml fluid restriction.  Lab Results  Component Value Date    HGBA1C 6.3* 02/21/2012   No further nutrition interventions at this time.   [name]

## 2012-02-22 NOTE — ED Provider Notes (Signed)
See prior note   Ward Givens, MD 02/22/12 1024

## 2012-02-23 LAB — GLUCOSE, CAPILLARY
Glucose-Capillary: 118 mg/dL — ABNORMAL HIGH (ref 70–99)
Glucose-Capillary: 128 mg/dL — ABNORMAL HIGH (ref 70–99)

## 2012-02-23 LAB — BASIC METABOLIC PANEL
Calcium: 9.2 mg/dL (ref 8.4–10.5)
Creatinine, Ser: 0.8 mg/dL (ref 0.50–1.10)
GFR calc Af Amer: >90 mL/min (ref >90)
Sodium: 139 mEq/L (ref 135–145)

## 2012-02-23 MED ORDER — ENALAPRIL MALEATE 10 MG PO TABS: 10.0000 mg | ORAL_TABLET | Freq: Two times a day (BID) | ORAL | Status: DC

## 2012-02-23 MED ORDER — CARVEDILOL 3.125 MG PO TABS: 3.1250 mg | ORAL_TABLET | Freq: Two times a day (BID) | ORAL | Status: DC

## 2012-02-23 MED ORDER — SPIRONOLACTONE 25 MG PO TABS: 25.0000 mg | ORAL_TABLET | Freq: Every day | ORAL | Status: DC

## 2012-02-23 MED ORDER — POTASSIUM CHLORIDE CRYS ER 20 MEQ PO TBCR: 20.0000 meq | EXTENDED_RELEASE_TABLET | Freq: Every day | ORAL | Status: DC

## 2012-02-23 MED ORDER — FUROSEMIDE 40 MG PO TABS: 40.0000 mg | ORAL_TABLET | Freq: Every day | ORAL | Status: DC

## 2012-02-23 MED ORDER — DIGOXIN 250 MCG PO TABS: 0.2500 mg | ORAL_TABLET | Freq: Every day | ORAL | Status: DC

## 2012-02-23 NOTE — Discharge Summary (Signed)
Advanced Heart Failure Team  Discharge Summary   Patient ID: [name] MRN: [id number], DOB/AGE: Jun 09, 1961 51 y.o. 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  She has been under increased stress recently with her husband dying in the cath lab during an MI and her son getting married as well as studying for NP school.  She noted progressive dyspnea/wheezing over the last couple of weeks and presented to the [facility] ER.    In the ER she was found to have sinus tach with HRs ranging 130-140.  Chest CT negative for PE but notable for pulmonary edema.  ProBNP 726.  Bedside echo showed LVEF ~25% with global hypokinesis and trivial MR.  She was admitted for acute systolic heart failure and IV diuresis.  She was started on ACE-I, digoxin, and spironolactone.  She diuresed 3 liters during admission and her lasix was transitioned to oral regimen.  Etiology felt to be NICM, most likely stressed induced.  Cath was deferred at this time to see if medical therapy improved EF.  TSH elevated but Free T3 and T4 within normal limits, ANA/HIV/Hepatitis panels normal.  Formal echo revealed, Left ventricle: The cavity size was severely dilated. Wall thickness was normal. The estimated ejection fraction was LVEF 25% with diffuse hypokinesis.  There was mild MR, mildly dilated LA.   On day of discharge, the patient was ambulating down the halls without dyspnea.  She denies orthopnea/PND/CP.  Weight is down to 219 pounds.  Renal function stable.  Dr. Gala Romney felt she was stable for home with close  outpatient follow up.    Discharge Weight Range: 218-220  Discharge Vitals: Blood pressure 103/59, pulse 96, temperature 98.1 F (36.7 C), temperature source Oral, resp. rate 18, height 5\' 8"  (1.727 m), weight 99.6 kg (219 lb 9.3 oz), last menstrual period 01/20/2012, SpO2 96.00%.  Physical Exam:  General: Well appearing. No resp difficulty  HEENT: normal  Neck: supple. JVP flat . Carotids 2+ bilat; no bruits. No lymphadenopathy or thryomegaly appreciated.  Cor: PMI displaced. Regular rate & rhythm. No rubs, gallops or murmurs.  Lungs: clear  Abdomen: soft, nontender, nondistended. No hepatosplenomegaly. No bruits or masses. Good bowel sounds.  Extremities: no cyanosis, clubbing, rash, edema  Neuro: alert & orientedx3, cranial nerves grossly intact. moves all 4 extremities w/o difficulty. Affect pleasant  Labs: Lab Results  Component Value Date   WBC 9.4 02/20/2012   HGB 12.4 02/20/2012   HCT 36.7 02/20/2012   MCV 84.6 02/20/2012   PLT 331 02/20/2012     Lab 02/23/12 0525  NA 139  K 4.1  CL 102  CO2 25  BUN 22  CREATININE 0.80  CALCIUM 9.2  PROT --  BILITOT --  ALKPHOS --  ALT --  AST --  GLUCOSE 122*    Basename 02/21/12 1728 02/21/12 0939 02/21/12 0149 02/20/12 1855  CKTOTAL 118 101 103 105  CKMB 1.4 1.7 1.8 1.7  TROPONINI <0.30 <0.30 <0.30 --   No results found for this basename: CHOL,  HDL,  LDLCALC,  TRIG   BNP (last 3 results)  Basename 02/22/12 2315 02/20/12 1836  PROBNP 457.9* 726.8*    Diagnostic Studies/Procedures   Echo  02/21/12 - Left ventricle: The cavity size was severely dilated. Wall thickness was normal. The estimated ejection fraction was 25%. Diffuse hypokinesis. - Mitral valve: Mild regurgitation. - Left atrium: The atrium was mildly dilated. - Atrial septum: No defect or patent foramen ovale was identified.   Dg Chest 2 View  02/20/2012  *RADIOLOGY REPORT*  Clinical Data: Shortness of breath and wheezing.  CHEST - 2 VIEW  Comparison:  None.  Findings: There is cardiomegaly without pulmonary edema.  No focal airspace disease or pleural effusion.  Convex left thoracolumbar scoliosis noted.  IMPRESSION: Cardiomegaly without acute disease.  Original Report Authenticated By: Bernadene Bell. Maricela Curet, M.D.   Ct Angio Chest W/cm &/or Wo Cm  02/20/2012  *RADIOLOGY REPORT*  Clinical Data: Short of breath.  Elevated D-dimer.  Clinical suspicion for pulmonary embolism.  CT ANGIOGRAPHY CHEST  Technique:  Multidetector CT imaging of the chest using the standard protocol during bolus administration of intravenous contrast. Multiplanar reconstructed images including MIPs were obtained and reviewed to evaluate the vascular anatomy.  Contrast: OMNIPAQUE IOHEXOL 350 MG/ML IV SOLN  Comparison: None.  Findings: Satisfactory opacification of the pulmonary arteries noted, and there is no evidence of pulmonary emboli.  No evidence of thoracic aortic aneurysm or mediastinal hematoma.  No evidence of mediastinal mass or lymphadenopathy.  No adenopathy seen elsewhere within the thorax.  Mild cardiomegaly is noted.  No evidence of pleural or pericardial effusion.  No evidence of pulmonary consolidation or mass.  Diffuse pulmonary interstitial thickening and thickening of the pulmonary interlobular septi seen, consistent with interstitial edema.  IMPRESSION:  1.  No evidence of pulmonary embolism. 2.  Cardiomegaly and diffuse thickening of interstitial lung markings, consistent with mild interstitial edema/congestive heart failure.  Original Report Authenticated By: Danae Orleans, M.D.    Discharge Medications   Medication List  As of 02/23/2012 10:40 AM   STOP taking these medications         metFORMIN 1000 MG (MOD) 24 hr tablet         TAKE these medications         carvedilol 3.125 MG tablet   Commonly known as: COREG   Take 1 tablet (3.125 mg total) by mouth 2 (two) times daily with a meal.      colesevelam 625 MG tablet   Commonly known as:  WELCHOL   Take 625 mg by mouth 2 (two) times daily with a meal.      digoxin 0.25 MG tablet   Commonly known as: LANOXIN   Take 1 tablet (0.25 mg total) by mouth daily.      enalapril 10 MG tablet   Commonly known as: VASOTEC   Take 1 tablet (10 mg total) by mouth 2 (two) times daily.      FLUoxetine 10 MG capsule   Commonly known as: PROZAC   Take 10 mg by mouth daily as needed. For anxiety      furosemide 40 MG tablet   Commonly known as: LASIX   Take 1 tablet (40 mg total) by mouth daily. May take additional tablet daily for weight gain greater than 3 lbs in 24 hours      ibuprofen 800 MG tablet   Commonly known as: ADVIL,MOTRIN   Take 800 mg by mouth every 8 (eight) hours as needed. For  pain      NUVARING VA   Place vaginally every 30 (thirty) days.      potassium chloride SA 20 MEQ tablet   Commonly known as: K-DUR,KLOR-CON   Take 1 tablet (20 mEq total) by mouth daily. Take additional tablet with extra lasix dose.      Saxagliptin-Metformin 2.03-999 MG Tb24   Take 1 tablet by mouth 2 (two) times daily.      sertraline 25 MG tablet   Commonly known as: ZOLOFT   Take 25 mg by mouth daily.      spironolactone 25 MG tablet   Commonly known as: ALDACTONE   Take 1 tablet (25 mg total) by mouth daily.            Disposition   The patient will be discharged in stable condition to home. Discharge Orders    Future Appointments: Provider: Department: Dept Phone: Center:   03/02/2012 10:15 AM [facility] [phone] None     Future Orders Please Complete By Expires   Diet - low sodium heart healthy      Increase activity slowly      Heart Failure patients record your daily weight using the same scale at the same time of day      STOP any activity that causes chest pain, shortness of breath, dizziness, sweating, or exessive weakness      (HEART FAILURE PATIENTS) Call MD:  Anytime you have any of the following symptoms: 1) 3 pound weight gain  in 24 hours or 5 pounds in 1 week 2) shortness of breath, with or without a dry hacking cough 3) swelling in the hands, feet or stomach 4) if you have to sleep on extra pillows at night in order to breathe.      Beta Blocker already ordered      ACE Inhibitor / ARB already ordered        Follow-up Information    Follow up with Arvilla Meres, MD on 03/02/2012. (at 10:15a       [employer] Code [phone]))    Contact information:   [facility] [address] [place] [address] [phone]            Duration of Discharge Encounter: Greater than 35 minutes   Signed, Robbi Garter PA-C 02/23/2012, 10:40 AM  Patient seen and examined with Ulyess Blossom PA-C on day of discharged. We discussed all aspects of the encounter. I agree with the entire note as stated above. Will follow closely in HF clinci.  Kazuko Clemence,MD 11:35 PM

## 2012-02-23 NOTE — Progress Notes (Signed)
IV d/c'd. Tele d/c'd. Pt d/c'd to home. Home meds and d/c instructions discussed with pt. Pt denies any questions or concerns at this time. Pt leaving unit via wheelchair and appears in no acute distress. Juanetta Negash RN 

## 2012-02-23 NOTE — Discharge Instructions (Signed)
Heart Failure Heart failure (HF) is a condition in which the heart has trouble pumping blood. This means your heart does not pump blood efficiently for your body to work well. In some cases of HF, fluid may back up into your lungs or you may have swelling (edema) in your lower legs. HF is a long-term (chronic) condition. It is important for you to take good care of yourself and follow your caregiver's treatment plan. CAUSES   Health conditions:   High blood pressure (hypertension) causes the heart muscle to work harder than normal. When pressure in the blood vessels is high, the heart needs to pump (contract) with more force in order to circulate blood throughout the body. High blood pressure eventually causes the heart to become stiff and weak.   Coronary artery disease (CAD) is the buildup of cholesterol and fat (plaques) in the arteries of the heart. The blockage in the arteries deprives the heart muscle of oxygen and blood. This can cause chest pain and may lead to a heart attack. High blood pressure can also contribute to CAD.   Heart attack (myocardial infarction) occurs when 1 or more arteries in the heart become blocked. The loss of oxygen damages the muscle tissue of the heart. When this happens, part of the heart muscle dies. The injured tissue does not contract as well and weakens the heart's ability to pump blood.   Abnormal heart valves can cause HF when the heart valves do not open and close properly. This makes the heart muscle pump harder to keep the blood flowing.   Heart muscle disease (cardiomyopathy or myocarditis) is damage to the heart muscle from a variety of causes. These can include drug or alcohol abuse, infections, or unknown reasons. These can increase the risk of HF.   Lung disease makes the heart work harder because the lungs do not work properly. This can cause a strain on the heart leading it to fail.   Diabetes increases the risk of HF. High blood sugar contributes  to high fat (lipid) levels in the blood. Diabetes can also cause slow damage to tiny blood vessels that carry important nutrients to the heart muscle. When the heart does not get enough oxygen and food, it can cause the heart to become weak and stiff. This leads to a heart that does not contract efficiently.   Other diseases can contribute to HF. These include abnormal heart rhythms, thyroid problems, and low blood counts (anemia).   Unhealthy lifestyle habits:   Obesity.   Smoking.   Eating foods high in fat and cholesterol.   Eating or drinking beverages high in salt.   Drug or alcohol abuse.   Lack of exercise.  SYMPTOMS  HF symptoms may vary and can be hard to detect. Symptoms may include:  Shortness of breath with activity, such as climbing stairs.   Persistent cough.   Swelling of the feet, ankles, legs, or abdomen.   Unexplained weight gain.   Difficulty breathing when lying flat.   Waking from sleep because of the need to sit up and get more air.   Rapid heartbeat.   Fatigue and loss of energy.   Feeling lightheaded or close to fainting.  DIAGNOSIS  A diagnosis of HF is based on your history, symptoms, physical examination, and diagnostic tests. Diagnostic tests for HF may include:  EKG.   Chest X-ray.   Blood tests.   Exercise stress test.   Blood oxygen test (arterial blood gas).   Evaluation   by a heart doctor (cardiologist).   Ultrasound evaluation of the heart (echocardiogram).   Heart artery test to look for blockages (angiogram).   Radioactive imaging to look at the heart (radionuclide test).  TREATMENT  Treatment is aimed at managing the symptoms of HF. Medicines, lifestyle changes, or surgical intervention may be necessary to treat HF.  Medicines to help treat HF may include:   Angiotensin-converting enzyme (ACE) inhibitors. These block the effects of a blood protein called angiotensin-converting enzyme. ACE inhibitors relax (dilate) the  blood vessels and help lower blood pressure. This decreases the workload of the heart, slows the progression of HF, and improves symptoms.   Angiotensin receptor blockers (ARBs). These medications work similar to ACE inhibitors. ARBs may be an alternative for people who cannot tolerate an ACE inhibitor.   Aldosterone antagonists. This medication helps get rid of extra fluid from your body. This lowers the volume of blood the heart has to pump.   Water pills (diuretics). Diuretics cause the kidneys to remove salt and water from the blood. The extra fluid is removed by urination. By removing extra fluid from the body, diuretics help lower the workload of the heart and help prevent fluid buildup in the lungs so breathing is easier.   Beta blockers. These prevent the heart from beating too fast and improve heart muscle strength. Beta blockers help maintain a normal heart rate, control blood pressure, and improve HF symptoms.   Digitalis. This increases the force of the heartbeat and may be helpful to people with HF or heart rhythm problems.   Healthy lifestyle changes include:   Stopping smoking.   Eating a healthy diet. Avoid foods high in fat. Avoid foods fried in oil or made with fat. A dietician can help with healthy food choices.   Limiting how much salt you eat.   Limiting alcohol intake to no more than 1 drink per day for women and 2 drinks per day for men. Drinking more than that is harmful to your heart. If your heart has already been damaged by alcohol or you have severe HF, drinking alcohol should be stopped completely.   Exercising as directed by your caregiver.   Surgical treatment for HF may include:   Procedures to open blocked arteries, repair damaged heart valves, or remove damaged heart muscle tissue.   A pacemaker to help heart muscle function and to control certain abnormal heart rhythms.   A defibrillator to possibly prevent sudden cardiac death.  HOME CARE  INSTRUCTIONS   Activity level. Your caregiver can help you determine what type of exercise program may be helpful. It is important to maintain your strength. Pace your physical activity to avoid shortness of breath or chest pain. Rest for 1 hour before and after meals. A cardiac rehabilitation program may be helpful to some people with HF.   Diet. Eat a heart healthy diet. Food choices should be low in saturated fat and cholesterol. Talk to a dietician to learn about heart healthy foods.   Salt intake. When you have HF, you need to limit the amount of salt you eat. Eat less than 1500 milligrams (mg) of salt per day or as recommended by your caregiver.   Weight monitoring. Weigh yourself every day. You should weigh yourself in the morning after you urinate and before you eat breakfast. Wear the same amount of clothing each time you weigh yourself. Record your weight daily. Bring your recorded weights to your clinic visits. Tell your caregiver right away if   you have gained 3 lb/1.4 kg in 1 day, or 5 lb/2.3 kg in a week or whatever amount you were told to report.   Blood pressure monitoring. This should be done as directed by your caregiver. A home blood pressure cuff can be purchased at a drugstore. Record your blood pressure numbers and bring them to your clinic visits. Tell your caregiver if you become dizzy or lightheaded upon standing up.   Smoking. If you are currently a smoker, it is time to quit. Nicotine makes your heart work harder by causing your blood vessels to constrict. Do not use nicotine gum or patches before talking to your caregiver.   Follow up. Be sure to schedule a follow-up visit with your caregiver. Keep all your appointments.  SEEK MEDICAL CARE IF:   Your weight increases by 3 lb/1.4 kg in 1 day or 5 lb/2.3 kg in a week.   You notice increasing shortness of breath that is unusual for you. This may happen during rest, sleep, or with activity.   You cough more than normal,  especially with physical activity.   You notice more swelling in your hands, feet, ankles, or belly (abdomen).   You are unable to sleep because it is hard to breathe.   You cough up bloody mucus (sputum).   You begin to feel "jumping" or "fluttering" sensations (palpitations) in your chest.  SEEK IMMEDIATE MEDICAL CARE IF:   You have severe chest pain or pressure which may include symptoms such as:   Pain or pressure in the arms, neck, jaw, or back.   Feeling sweaty.   Feeling sick to your stomach (nauseous).   Feeling short of breath while at rest.   Having a fast or irregular heartbeat.   You experience stroke symptoms. These symptoms include:   Facial weakness or numbness.   Weakness or numbness in an arm, leg, or on one side of your body.   Blurred vision.   Difficulty talking or thinking.   Dizziness or fainting.   Severe headache.  THESE ARE MEDICAL EMERGENCIES. Do not wait to see if the symptoms go away. Call your local emergency services (911 in [place] DO NOT drive yourself to the hospital. IMPORTANT  Make a list of every medicine, vitamin, or herbal supplement you are taking. Keep the list with you at all times. Show it to your caregiver at every visit. Keep the list up-to-date.   Ask your caregiver or pharmacist to write an explanation of each medicine you are taking. This should include:   Why you are taking it.   The possible side effects.   The best time of day to take it.   Foods to take with it or what foods to avoid.   When to stop taking it.  MAKE SURE YOU:   Understand these instructions.   Will watch your condition.   Will get help right away if you are not doing well or get worse.  Document Released: 11/16/2005 Document Revised: 11/05/2011 Document Reviewed: 02/28/2010 [employer] Patient Information 2012 [employer]. 

## 2012-03-02 ENCOUNTER — Telehealth (HOSPITAL_COMMUNITY): Payer: Self-pay | Admitting: Vascular Surgery

## 2012-03-02 ENCOUNTER — Ambulatory Visit (HOSPITAL_COMMUNITY)
Admission: RE | Admit: 2012-03-02 | Discharge: 2012-03-02 | Disposition: A | Discharge status: Home / Self Care | Payer: 59 | Source: Ambulatory Visit | Attending: Internal Medicine | Admitting: Internal Medicine

## 2012-03-02 ENCOUNTER — Encounter (HOSPITAL_COMMUNITY): Payer: Self-pay

## 2012-03-02 VITALS — BP 118/78 | HR 83 | Wt 220.0 lb

## 2012-03-02 DIAGNOSIS — I5040 Unspecified combined systolic (congestive) and diastolic (congestive) heart failure: Secondary | ICD-10-CM | POA: Insufficient documentation

## 2012-03-02 DIAGNOSIS — I5021 Acute systolic (congestive) heart failure: Secondary | ICD-10-CM

## 2012-03-02 DIAGNOSIS — R002 Palpitations: Secondary | ICD-10-CM

## 2012-03-02 DIAGNOSIS — I5022 Chronic systolic (congestive) heart failure: Secondary | ICD-10-CM

## 2012-03-02 LAB — BASIC METABOLIC PANEL
BUN: 15 mg/dL (ref 6–23)
GFR calc Af Amer: >90 mL/min (ref >90)
GFR calc non Af Amer: >90 mL/min (ref >90)
Potassium: 4.2 mEq/L (ref 3.5–5.1)
Sodium: 135 mEq/L (ref 135–145)

## 2012-03-02 MED ORDER — CARVEDILOL 6.25 MG PO TABS: 6.2500 mg | ORAL_TABLET | Freq: Two times a day (BID) | ORAL | Status: DC

## 2012-03-02 NOTE — Progress Notes (Signed)
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 She has been under increased stress recently with her husband dying in the cath lab during an MI and her son getting married as well as studying for NP school.   She was admitted to [facility] in 01/2011 for acute systolic heart failure, presumed NICM, EF 25% (no cath or stress testing performed).  ProBNP 726.  Started on ACE-I, carvedilol, digoxin, and spironolactone. There was mild MR, mildly dilated LA.  TSH elevated but Free T3 and T4 within normal limits, ANA/HIV/Hepatitis panels normal.   She returns for post hospital follow up today. Feeling very good. No dyspnea, edema, orthopnea or PND, Taking all meds as directed. Functional capacity much improved - able to go up steps without a problem. Weight stable at home at 215. Occasional palpitations getting much less prominent. Can last 5-15 minutes. Often after eating. Takes pulse and is regular in 80s. No syncope or presyncope.    ROS: All systems negative except as listed in HPI, PMH and Problem List.  Past Medical History  Diagnosis Date  . PCOS (polycystic ovarian syndrome)   . Mitral valve prolapse   . Diabetes mellitus     since 2002, NIDDM on OHAS  . Kidney calculi     lithotripsy 20112 times    Current Outpatient Prescriptions  Medication Sig Dispense Refill  . carvedilol (COREG) 3.125 MG tablet Take 1 tablet (3.125 mg total) by mouth 2 (two) times daily with a meal.  60 tablet  3  . colesevelam (WELCHOL) 625 MG tablet Take 625 mg by mouth 2 (two) times daily with a meal.      . digoxin (LANOXIN) 0.25 MG tablet Take 1 tablet (0.25 mg total) by mouth daily.  30 tablet  6  . enalapril (VASOTEC) 10 MG tablet Take 1 tablet (10 mg total) by mouth 2 (two) times daily.  60 tablet  6  . Etonogestrel-Ethinyl Estradiol (NUVARING VA) Place  vaginally every 30 (thirty) days.        Marland Kitchen FLUoxetine (PROZAC) 10 MG capsule Take 10 mg by mouth daily as needed. For anxiety      . furosemide (LASIX) 40 MG tablet Take 1 tablet (40 mg total) by mouth daily. May take additional tablet daily for weight gain greater than 3 lbs in 24 hours  45 tablet  6  . ibuprofen (ADVIL,MOTRIN) 800 MG tablet Take 800 mg by mouth every 8 (eight) hours as needed. For pain      . potassium chloride SA (K-DUR,KLOR-CON) 20 MEQ tablet Take 1 tablet (20 mEq total) by mouth daily. Take additional tablet with extra lasix dose.  45 tablet  6  . Saxagliptin-Metformin 2.03-999 MG TB24 Take 1 tablet by mouth 2 (two) times daily.      . sertraline (ZOLOFT) 25 MG tablet Take 25 mg by mouth daily.      Marland Kitchen spironolactone (ALDACTONE) 25 MG tablet Take 1 tablet (25 mg total) by mouth daily.  30 tablet  6  . DISCONTD: metFORMIN (GLUMETZA) 1000 MG (MOD) 24 hr tablet Take 1,000 mg by mouth daily with breakfast.           PHYSICAL EXAM: Filed Vitals:   03/02/12 1021  BP: 118/78  Pulse: 83  Weight: 220 lb (99.791 kg)  SpO2: 96%    General:  Well appearing. No resp difficulty HEENT: normal Neck: supple. JVP flat. Carotids 2+ bilaterally; no bruits. No lymphadenopathy or thryomegaly appreciated. Cor: PMI normal. Regular rate & rhythm. No rubs, gallops or murmurs. Lungs: clear Abdomen: soft, nontender, nondistended. No hepatosplenomegaly. No bruits or masses. Good bowel sounds. Extremities: no cyanosis, clubbing, rash, edema Neuro: alert & orientedx3, cranial nerves grossly intact. Moves all 4 extremities w/o difficulty. Affect pleasant.    ASSESSMENT & PLAN:

## 2012-03-02 NOTE — Patient Instructions (Signed)
Increase Carvedilol 6.25 mg Twice daily   Labs today  Your physician recommends that you schedule a follow-up appointment in: 3 weeks

## 2012-03-02 NOTE — Assessment & Plan Note (Signed)
Doing very well. NYHA II. Volume status looks good. Will increase carvedilol to 6.25 bid. I suspect she has stress-induced CM and should recover relatively quickly. However, given high level of stress in her work I have asked her to remain out of work until we can titrate meds and make sure EF is substantially improved. Check labs today.

## 2012-03-02 NOTE — Telephone Encounter (Signed)
Pt called needing a note saying she is out of work. 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

## 2012-03-02 NOTE — Assessment & Plan Note (Signed)
I suspect these are atrial in nature but need to exclude significant ventricular ectopy. Place 48-hour Holter. If significant NSVT or VT will need LifeVest.

## 2012-03-03 ENCOUNTER — Encounter (HOSPITAL_COMMUNITY): Payer: Self-pay | Admitting: *Deleted

## 2012-03-03 NOTE — Telephone Encounter (Signed)
Pt aware letter was faxed.

## 2012-03-04 ENCOUNTER — Telehealth: Payer: Self-pay

## 2012-03-07 ENCOUNTER — Encounter (HOSPITAL_COMMUNITY): Payer: Self-pay | Admitting: *Deleted

## 2012-03-07 ENCOUNTER — Telehealth (HOSPITAL_COMMUNITY): Payer: Self-pay | Admitting: *Deleted

## 2012-03-07 NOTE — Telephone Encounter (Signed)
[name] called today.  She needs a letter to return to school, with no restrictions, this is for course work only. Please fax it to [phone], Attn Dr Lorin Picket. Thanks.

## 2012-03-07 NOTE — Telephone Encounter (Signed)
Letter faxed, pt aware

## 2012-03-08 ENCOUNTER — Telehealth (HOSPITAL_COMMUNITY): Payer: Self-pay | Admitting: *Deleted

## 2012-03-08 NOTE — Telephone Encounter (Signed)
Pt had dropped off form to be completed for her disability, form was completed and signed by Dr Gala Romney and faxed to [facility] at [phone]

## 2012-03-14 NOTE — Telephone Encounter (Signed)
Patient cal call back to make appt for 03/17/2012

## 2012-03-22 ENCOUNTER — Encounter (INDEPENDENT_AMBULATORY_CARE_PROVIDER_SITE_OTHER): Payer: 59

## 2012-03-22 DIAGNOSIS — R002 Palpitations: Secondary | ICD-10-CM

## 2012-03-30 ENCOUNTER — Ambulatory Visit (HOSPITAL_COMMUNITY)
Admission: RE | Admit: 2012-03-30 | Discharge: 2012-03-30 | Disposition: A | Discharge status: Home / Self Care | Payer: 59 | Source: Ambulatory Visit | Attending: Internal Medicine | Admitting: Internal Medicine

## 2012-03-30 VITALS — BP 120/70 | HR 83 | Wt 220.0 lb

## 2012-03-30 DIAGNOSIS — I5022 Chronic systolic (congestive) heart failure: Secondary | ICD-10-CM | POA: Insufficient documentation

## 2012-03-30 DIAGNOSIS — I4892 Unspecified atrial flutter: Secondary | ICD-10-CM | POA: Insufficient documentation

## 2012-03-30 DIAGNOSIS — R002 Palpitations: Secondary | ICD-10-CM | POA: Insufficient documentation

## 2012-03-30 MED ORDER — CARVEDILOL 6.25 MG PO TABS: 9.3750 mg | ORAL_TABLET | Freq: Two times a day (BID) | ORAL | Status: DC

## 2012-03-30 MED ORDER — AMIODARONE HCL 200 MG PO TABS: 200.0000 mg | ORAL_TABLET | Freq: Two times a day (BID) | ORAL | Status: DC

## 2012-03-30 MED ORDER — APIXABAN 5 MG PO TABS: 5.0000 mg | ORAL_TABLET | Freq: Two times a day (BID) | ORAL | Status: DC

## 2012-03-30 NOTE — Progress Notes (Signed)
Patient ID: [name], female   DOB: 06/27/1961, 51 y.o.   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 She has been under increased stress recently with her husband dying in the cath lab during an MI and her son getting married as well as studying for NP school.   She was admitted to [facility] in 01/2012 for acute systolic heart failure, presumed NICM, EF 25% (no cath or stress testing performed).  ProBNP 726.  Started on ACE-I, carvedilol, digoxin, and spironolactone. There was mild MR, mildly dilated LA.  TSH elevated but Free T3 and T4 within normal limits, ANA/HIV/Hepatitis panels normal.   Event monitor: 03/25/12 Frequent PVCs   A-Flutter  She returns for follow up. Last visit, carvedilol increased to 6.25 mg BID. Complains of fatigue. Intermittent dizziness at rest. Ongoing palpitations. Denies SOB/PND/Orthopnea. Weight at home 215-218. Denies lower extremity edema. She continues work on NP degree.   ROS: All systems negative except as listed in HPI, PMH and Problem List.  Past Medical History  Diagnosis Date  . PCOS (polycystic ovarian syndrome)   . Mitral valve prolapse   . Diabetes mellitus     since 2002, NIDDM on OHAS  . Kidney calculi     lithotripsy 20112 times    Current Outpatient Prescriptions  Medication Sig Dispense Refill  . ALPRAZolam (XANAX) 0.25 MG tablet Take 0.25 mg by mouth 3 (three) times daily as needed.      Marland Kitchen CALCIUM-MAGNESIUM-ZINC PO Take 1 tablet by mouth daily.      . carvedilol (COREG) 6.25 MG tablet Take 1 tablet (6.25 mg total) by mouth 2 (two) times daily with a meal.  60 tablet  6  . colesevelam (WELCHOL) 625 MG tablet Take 625 mg by mouth 2 (two) times daily with a meal.      . digoxin (LANOXIN) 0.25 MG tablet Take 1 tablet (0.25 mg total) by mouth daily.  30 tablet  6  .  enalapril (VASOTEC) 10 MG tablet Take 1 tablet (10 mg total) by mouth 2 (two) times daily.  60 tablet  6  . Etonogestrel-Ethinyl Estradiol (NUVARING VA) Place vaginally every 30 (thirty) days.        . fish oil-omega-3 fatty acids 1000 MG capsule Take 2 g by mouth daily. Krill oil capsule      . FLUoxetine (PROZAC) 10 MG capsule Take 10 mg by mouth daily as needed. For anxiety      . furosemide (LASIX) 40 MG tablet Take 1 tablet (40 mg total) by mouth daily. May take additional tablet daily for weight gain greater than 3 lbs in 24 hours  45 tablet  6  . ibuprofen (ADVIL,MOTRIN) 800 MG tablet Take 800 mg by mouth every 8 (eight) hours as needed. For pain      . Multiple Vitamin (MULTIVITAMIN) tablet Take 1 tablet by mouth daily.      . potassium chloride SA (K-DUR,KLOR-CON) 20 MEQ tablet Take 1 tablet (20 mEq total) by mouth daily. Take additional tablet with extra lasix dose.  45 tablet  6  . Saxagliptin-Metformin 2.03-999 MG TB24 Take 1 tablet by mouth 2 (two) times daily with a meal.       . sertraline (ZOLOFT) 25 MG tablet Take 25 mg by mouth daily.      Marland Kitchen  spironolactone (ALDACTONE) 25 MG tablet Take 1 tablet (25 mg total) by mouth daily.  30 tablet  6  . DISCONTD: metFORMIN (GLUMETZA) 1000 MG (MOD) 24 hr tablet Take 1,000 mg by mouth daily with breakfast.           PHYSICAL EXAM: Filed Vitals:   03/30/12 1355  BP: 120/70  Pulse: 83  Weight: 220 lb (99.791 kg)  SpO2: 99%    General:  Well appearing. No resp difficulty HEENT: normal Neck: supple. JVP flat. Carotids 2+ bilaterally; no bruits. No lymphadenopathy or thryomegaly appreciated. Cor: PMI normal. Regular rate & rhythm. No rubs, gallops or murmurs. Lungs: clear Abdomen: soft, nontender, nondistended. No hepatosplenomegaly. No bruits or masses. Good bowel sounds. Extremities: no cyanosis, clubbing, rash, edema Neuro: alert & orientedx3, cranial nerves grossly intact. Moves all 4 extremities w/o difficulty. Affect  pleasant.    ASSESSMENT & PLAN:

## 2012-03-30 NOTE — Assessment & Plan Note (Addendum)
Discussed event monitor results. Frequent PVCs and A-Flutter noted. Will refer to EP.

## 2012-03-30 NOTE — Assessment & Plan Note (Addendum)
Event monitor results reviewed with Dr Gala Romney. Frequent PVCs and A flutter noted.Will start Amiodarone 200 mg bid.    Patient seen and examined with Tonye Becket, NP. We discussed all aspects of the encounter. I agree with the assessment and plan as stated above.  Monitor shows brief runs of AFL and 1% PVCS/bigeminy. Discussed with Dr. Graciela Husbands at length. Will start amio 200 bid at least for short course and start apixiban.

## 2012-03-30 NOTE — Patient Instructions (Addendum)
Take Eliquis 5 mg BID   Start Amiodarone 200 mg BID  Repeat ECHO in 10 days with and ECHO

## 2012-03-30 NOTE — Assessment & Plan Note (Addendum)
NYHA II. Volume status stable. Follow up in 10 days with an ECHO. Consider increasing Carvedilol 9.375 mg at next visit.   Patient seen and examined with Tonye Becket, NP. We discussed all aspects of the encounter. I agree with the assessment and plan as stated above. Functional capacity and volume status improving. Will not titrate carvedilol at this point as we are stating amio. Continue current rx for now. Check echo in 2 weeks.

## 2012-04-08 ENCOUNTER — Ambulatory Visit (HOSPITAL_COMMUNITY)
Admission: RE | Admit: 2012-04-08 | Discharge: 2012-04-08 | Disposition: A | Discharge status: Home / Self Care | Payer: 59 | Source: Ambulatory Visit | Attending: Internal Medicine | Admitting: Internal Medicine

## 2012-04-08 ENCOUNTER — Ambulatory Visit (HOSPITAL_COMMUNITY)
Admission: RE | Admit: 2012-04-08 | Discharge: 2012-04-08 | Disposition: A | Discharge status: Home / Self Care | Payer: 59 | Source: Ambulatory Visit | Attending: Adult Health | Admitting: Adult Health

## 2012-04-08 VITALS — BP 118/74 | HR 86 | Wt 214.8 lb

## 2012-04-08 DIAGNOSIS — I517 Cardiomegaly: Secondary | ICD-10-CM

## 2012-04-08 DIAGNOSIS — I4892 Unspecified atrial flutter: Secondary | ICD-10-CM

## 2012-04-08 DIAGNOSIS — I5022 Chronic systolic (congestive) heart failure: Secondary | ICD-10-CM

## 2012-04-08 DIAGNOSIS — E119 Type 2 diabetes mellitus without complications: Secondary | ICD-10-CM | POA: Insufficient documentation

## 2012-04-08 DIAGNOSIS — I509 Heart failure, unspecified: Secondary | ICD-10-CM | POA: Insufficient documentation

## 2012-04-08 MED ORDER — CARVEDILOL 6.25 MG PO TABS: 9.3750 mg | ORAL_TABLET | Freq: Two times a day (BID) | ORAL | Status: DC

## 2012-04-08 NOTE — Progress Notes (Signed)
  Echocardiogram 2D Echocardiogram has been performed.  Dorena Cookey 04/08/2012, 11:29 AM

## 2012-04-08 NOTE — Assessment & Plan Note (Addendum)
Repeat EKG. NSR  76.Continue Amiodarone 200 mg twice a day

## 2012-04-08 NOTE — Progress Notes (Signed)
Patient ID: [name], female   DOB: Apr 22, 1961, 51 y.o.   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 She has been under increased stress recently with her husband dying in the cath lab during an MI and her son getting married as well as studying for NP school.   She was admitted to [facility] in 01/2012 for acute systolic heart failure, presumed NICM, EF 25% (no cath or stress testing performed).  ProBNP 726.  Started on ACE-I, carvedilol, digoxin, and spironolactone. There was mild MR, mildly dilated LA.  TSH elevated but Free T3 and T4 within normal limits, ANA/HIV/Hepatitis panels normal.   Event monitor: 03/25/12 Frequent PVCs   A-Flutter  Last visit brief runs AFL and PVCs bigeminy. Dr Gala Romney discussed with Dr Graciela Husbands and she started Amiodarone 200 mg bid.  04/08/12 ECHO EF 30-35% LVIDd 5.6 (previous LVIDd 6.2 EF 25%)  She returns for follow up. Denies SOB/ PND/Orthopnea. Decreased palpitations. SOB/PND/Orthopnea. Weight at home trending down to 211 pounds. Denies lower extremity edema. She continues to work on NP degree.   ROS: All systems negative except as listed in HPI, PMH and Problem List.  Past Medical History  Diagnosis Date  . PCOS (polycystic ovarian syndrome)   . Mitral valve prolapse   . Diabetes mellitus     since 2002, NIDDM on OHAS  . Kidney calculi     lithotripsy 20112 times    Current Outpatient Prescriptions  Medication Sig Dispense Refill  . ALPRAZolam (XANAX) 0.25 MG tablet Take 0.25 mg by mouth 3 (three) times daily as needed.      Marland Kitchen amiodarone (PACERONE) 200 MG tablet Take 1 tablet (200 mg total) by mouth 2 (two) times daily.  60 tablet  6  . apixaban (ELIQUIS) 5 MG TABS tablet Take 1 tablet (5 mg total) by mouth 2 (two) times daily.  60 tablet  2  . CALCIUM-MAGNESIUM-ZINC PO Take 1  tablet by mouth daily.      . carvedilol (COREG) 6.25 MG tablet Take 6.25 mg by mouth 2 (two) times daily with a meal.      . colesevelam (WELCHOL) 625 MG tablet Take 625 mg by mouth 2 (two) times daily with a meal.      . digoxin (LANOXIN) 0.25 MG tablet Take 1 tablet (0.25 mg total) by mouth daily.  30 tablet  6  . enalapril (VASOTEC) 10 MG tablet Take 1 tablet (10 mg total) by mouth 2 (two) times daily.  60 tablet  6  . Etonogestrel-Ethinyl Estradiol (NUVARING VA) Place vaginally every 30 (thirty) days.        . fish oil-omega-3 fatty acids 1000 MG capsule Take 2 g by mouth daily. Krill oil capsule      . furosemide (LASIX) 40 MG tablet Take 1 tablet (40 mg total) by mouth daily. May take additional tablet daily for weight gain greater than 3 lbs in 24 hours  45 tablet  6  . ibuprofen (ADVIL,MOTRIN) 800 MG tablet Take 800 mg by mouth every 8 (eight) hours as needed. For pain      . Multiple Vitamin (MULTIVITAMIN) tablet Take 1 tablet by mouth daily.      . potassium chloride SA (K-DUR,KLOR-CON) 20 MEQ tablet Take 1 tablet (20 mEq total) by mouth daily. Take additional tablet  with extra lasix dose.  45 tablet  6  . Saxagliptin-Metformin 2.03-999 MG TB24 Take 1 tablet by mouth 2 (two) times daily with a meal.       . sertraline (ZOLOFT) 25 MG tablet Take 25 mg by mouth daily.      Marland Kitchen spironolactone (ALDACTONE) 25 MG tablet Take 1 tablet (25 mg total) by mouth daily.  30 tablet  6  . DISCONTD: carvedilol (COREG) 6.25 MG tablet Take 1.5 tablets (9.375 mg total) by mouth 2 (two) times daily with a meal.  90 tablet  6  . DISCONTD: metFORMIN (GLUMETZA) 1000 MG (MOD) 24 hr tablet Take 1,000 mg by mouth daily with breakfast.           PHYSICAL EXAM: Filed Vitals:   04/08/12 1158  BP: 118/74  Pulse: 86  Weight: 214 lb 12 oz (97.41 kg)  SpO2: 96%    General:  Well appearing. No resp difficulty HEENT: normal Neck: supple. JVP flat. Carotids 2+ bilaterally; no bruits. No lymphadenopathy or  thryomegaly appreciated. Cor: PMI normal. Regular rate & rhythm. No rubs, gallops or murmurs. Lungs: clear Abdomen: soft, nontender, nondistended. No hepatosplenomegaly. No bruits or masses. Good bowel sounds. Extremities: no cyanosis, clubbing, rash, edema Neuro: alert & orientedx3, cranial nerves grossly intact. Moves all 4 extremities w/o difficulty. Affect pleasant.    ASSESSMENT & PLAN:

## 2012-04-08 NOTE — Patient Instructions (Addendum)
Take Carvedilol 9.375 twice a day  You may obtain massages  Follow up in 3 weeks  Do the following things EVERYDAY: 1) Weigh yourself in the morning before breakfast. Write it down and keep it in a log. 2) Take your medicines as prescribed 3) Eat low salt foods--Limit salt (sodium) to 2000 mg per day.  4) Stay as active as you can everyday 5) Limit all fluids for the day to less than 2 liters

## 2012-04-08 NOTE — Assessment & Plan Note (Addendum)
She returns for follow up. Repeat ECHO revealed EF 35-40% previous EF 25%.  LVIDd decreased to 5.6 from 6.2 which likely indicates reverse remodeling. Will increase carvedilol 9.375 mg twice a day. Discussed potential side effects such as fatigue and edema. Follow up in 2 months with an ECHO. She will continue stay out of work and will readdress at next office visit.   Patient seen and examined with Tonye Becket, NP. We discussed all aspects of the encounter. I agree with the assessment and plan as stated above. We reviewed echo together. EF improving, less dilated. Symptomatically better. Suspect she will make full recovery. Continue to titrate meds. Volume status looks good on exam.

## 2012-04-12 ENCOUNTER — Telehealth (HOSPITAL_COMMUNITY): Payer: Self-pay | Admitting: *Deleted

## 2012-04-12 MED ORDER — FUROSEMIDE 40 MG PO TABS: 40.0000 mg | ORAL_TABLET | Freq: Every day | ORAL | Status: DC

## 2012-04-12 MED ORDER — AMIODARONE HCL 200 MG PO TABS: 200.0000 mg | ORAL_TABLET | Freq: Two times a day (BID) | ORAL | Status: DC

## 2012-04-12 MED ORDER — CARVEDILOL 6.25 MG PO TABS: 9.3750 mg | ORAL_TABLET | Freq: Two times a day (BID) | ORAL | Status: DC

## 2012-04-12 MED ORDER — SPIRONOLACTONE 25 MG PO TABS: 25.0000 mg | ORAL_TABLET | Freq: Every day | ORAL | Status: DC

## 2012-04-12 MED ORDER — APIXABAN 5 MG PO TABS: 5.0000 mg | ORAL_TABLET | Freq: Two times a day (BID) | ORAL | Status: DC

## 2012-04-12 MED ORDER — DIGOXIN 250 MCG PO TABS: 0.2500 mg | ORAL_TABLET | Freq: Every day | ORAL | Status: DC

## 2012-04-12 MED ORDER — POTASSIUM CHLORIDE CRYS ER 20 MEQ PO TBCR: 20.0000 meq | EXTENDED_RELEASE_TABLET | Freq: Every day | ORAL | Status: DC

## 2012-04-12 MED ORDER — ENALAPRIL MALEATE 10 MG PO TABS: 10.0000 mg | ORAL_TABLET | Freq: Two times a day (BID) | ORAL | Status: DC

## 2012-04-12 NOTE — Telephone Encounter (Signed)
[name] [place] the [place] and does not have her medications with her. She needs a 5 day supply of the following meds, coreg, digoxin, lasix, eranalapril, potassium, all heart meds, to [facility] in [place], the number is [phone] or [phone].  She needs them by 4:30 this afternoon, the pharmacy closes at 5. Please call her back. Thanks.

## 2012-04-12 NOTE — Telephone Encounter (Signed)
Left mess all cardiac meds were sent to the pharmacy

## 2012-04-14 ENCOUNTER — Encounter: Payer: Self-pay | Admitting: Internal Medicine

## 2012-05-02 ENCOUNTER — Encounter (HOSPITAL_COMMUNITY): Payer: 59

## 2012-05-03 ENCOUNTER — Ambulatory Visit (HOSPITAL_COMMUNITY)
Admission: RE | Admit: 2012-05-03 | Discharge: 2012-05-03 | Disposition: A | Discharge status: Home / Self Care | Payer: 59 | Source: Ambulatory Visit | Attending: Internal Medicine | Admitting: Internal Medicine

## 2012-05-03 VITALS — BP 118/72 | HR 78 | Wt 215.5 lb

## 2012-05-03 DIAGNOSIS — E669 Obesity, unspecified: Secondary | ICD-10-CM | POA: Insufficient documentation

## 2012-05-03 DIAGNOSIS — I5022 Chronic systolic (congestive) heart failure: Secondary | ICD-10-CM | POA: Insufficient documentation

## 2012-05-03 DIAGNOSIS — I4892 Unspecified atrial flutter: Secondary | ICD-10-CM | POA: Insufficient documentation

## 2012-05-03 DIAGNOSIS — Z87442 Personal history of urinary calculi: Secondary | ICD-10-CM | POA: Insufficient documentation

## 2012-05-03 DIAGNOSIS — I059 Rheumatic mitral valve disease, unspecified: Secondary | ICD-10-CM | POA: Insufficient documentation

## 2012-05-03 DIAGNOSIS — E119 Type 2 diabetes mellitus without complications: Secondary | ICD-10-CM | POA: Insufficient documentation

## 2012-05-03 DIAGNOSIS — F411 Generalized anxiety disorder: Secondary | ICD-10-CM | POA: Insufficient documentation

## 2012-05-03 DIAGNOSIS — F329 Major depressive disorder, single episode, unspecified: Secondary | ICD-10-CM | POA: Insufficient documentation

## 2012-05-03 DIAGNOSIS — E282 Polycystic ovarian syndrome: Secondary | ICD-10-CM | POA: Insufficient documentation

## 2012-05-03 DIAGNOSIS — F3289 Other specified depressive episodes: Secondary | ICD-10-CM | POA: Insufficient documentation

## 2012-05-03 NOTE — Patient Instructions (Signed)
Increase carvedilol 12.5 mg (2 tabs) at night then after 5 days increase morning dose.  Follow up at the end of June with Dr. Gala Romney with an echo.

## 2012-05-03 NOTE — Assessment & Plan Note (Signed)
Her volume status looks good today.  NYHA II.  Will increase coreg slowly, she will increase night dose to 12.5 mg for 4-5 days and if she tolerates this ok she will increase morning dose as well.  She voices understanding.  Reinforced sliding scale lasix.  Will follow up in 3 weeks with echo.  Currently awaiting echo for possibility to return to work.

## 2012-05-03 NOTE — Assessment & Plan Note (Signed)
Continue amiodarone. 

## 2012-05-03 NOTE — Progress Notes (Signed)
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 She has been under increased stress recently with her husband dying in the cath lab during an MI and her son getting married as well as studying for NP school.   She was admitted to [facility] in 01/2012 for acute systolic heart failure, presumed NICM, EF 25% (no cath or stress testing performed).  ProBNP 726.  Started on ACE-I, carvedilol, digoxin, and spironolactone. There was mild MR, mildly dilated LA.  TSH elevated but Free T3 and T4 within normal limits, ANA/HIV/Hepatitis panels normal.   Event monitor: 03/25/12 Frequent PVCs   A-Flutter  Last visit brief runs AFL and PVCs bigeminy. Dr Gala Romney discussed with Dr Graciela Husbands and she started Amiodarone 200 mg bid.  04/08/12 ECHO EF 30-35% LVIDd 5.6 (previous LVIDd 6.2 EF 25%)  She returns for follow up today.  She is doing well but with fatigue.  She noted increased fatigue for about a week with some edema after increase coreg last visit.  She currently SOB/ PND/Orthopnea.  Continues to have palpitations when she lays down at night.  Her weight is stable.  She took an extra dose of lasix while a the beach which improved her edema.  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     ROS: All systems negative except as listed in HPI, PMH and Problem List.  Past Medical History  Diagnosis Date  . PCOS (polycystic ovarian syndrome)   . Mitral valve prolapse   . Diabetes mellitus     since 2002, NIDDM on OHAS  . Kidney calculi     lithotripsy 20112 times    Current Outpatient Prescriptions  Medication Sig Dispense Refill  . ALPRAZolam (XANAX) 0.25 MG tablet Take 0.25 mg by mouth 3 (three) times daily as needed.      Marland Kitchen amiodarone (PACERONE) 200 MG tablet Take 1 tablet (200 mg total) by mouth 2 (two) times daily.  10  tablet  0  . apixaban (ELIQUIS) 5 MG TABS tablet Take 1 tablet (5 mg total) by mouth 2 (two) times daily.  10 tablet  0  . CALCIUM-MAGNESIUM-ZINC PO Take 1 tablet by mouth daily.      . carvedilol (COREG) 6.25 MG tablet Take 1.5 tablets (9.375 mg total) by mouth 2 (two) times daily with a meal.  15 tablet  0  . colesevelam (WELCHOL) 625 MG tablet Take 625 mg by mouth 2 (two) times daily with a meal.      . digoxin (LANOXIN) 0.25 MG tablet Take 1 tablet (0.25 mg total) by mouth daily.  5 tablet  0  . enalapril (VASOTEC) 10 MG tablet Take 1 tablet (10 mg total) by mouth 2 (two) times daily.  10 tablet  0  . Etonogestrel-Ethinyl Estradiol (NUVARING VA) Place vaginally every 30 (thirty) days.        . fish oil-omega-3 fatty acids 1000 MG capsule Take 2 g by mouth daily. Krill oil capsule      . furosemide (LASIX) 40 MG tablet Take 1 tablet (40 mg total) by mouth daily. May take additional tablet daily for weight gain greater than 3 lbs in 24 hours  8 tablet  0  . ibuprofen (ADVIL,MOTRIN) 800 MG tablet Take 800 mg by mouth every 8 (eight) hours  as needed. For pain      . Multiple Vitamin (MULTIVITAMIN) tablet Take 1 tablet by mouth daily.      . potassium chloride SA (K-DUR,KLOR-CON) 20 MEQ tablet Take 1 tablet (20 mEq total) by mouth daily. Take additional tablet with extra lasix dose.  5 tablet  0  . rizatriptan (MAXALT) 10 MG tablet Take 10 mg by mouth as needed. May repeat in 2 hours if needed      . Saxagliptin-Metformin 2.03-999 MG TB24 Take 1 tablet by mouth 2 (two) times daily with a meal.       . sertraline (ZOLOFT) 25 MG tablet Take 25 mg by mouth daily.      Marland Kitchen spironolactone (ALDACTONE) 25 MG tablet Take 1 tablet (25 mg total) by mouth daily.  5 tablet  0  . DISCONTD: metFORMIN (GLUMETZA) 1000 MG (MOD) 24 hr tablet Take 1,000 mg by mouth daily with breakfast.           PHYSICAL EXAM: Filed Vitals:   05/03/12 1114  BP: 118/72  Pulse: 78  Weight: 215 lb 8 oz (97.75 kg)  SpO2: 99%     General:  Well appearing. No resp difficulty HEENT: normal Neck: supple. JVP flat. Carotids 2+ bilaterally; no bruits. No lymphadenopathy or thryomegaly appreciated. Cor: PMI normal. Regular rate & rhythm. No rubs, gallops or murmurs. Lungs: clear Abdomen: soft, nontender, nondistended. No hepatosplenomegaly. No bruits or masses. Good bowel sounds. Extremities: no cyanosis, clubbing, rash, edema Neuro: alert & orientedx3, cranial nerves grossly intact. Moves all 4 extremities w/o difficulty. Affect pleasant.    ASSESSMENT & PLAN:

## 2012-05-04 ENCOUNTER — Telehealth (HOSPITAL_COMMUNITY): Payer: Self-pay | Admitting: *Deleted

## 2012-05-04 ENCOUNTER — Encounter (HOSPITAL_COMMUNITY): Payer: Self-pay | Admitting: *Deleted

## 2012-05-04 NOTE — Telephone Encounter (Signed)
Pt aware letter was composed and faxed in

## 2012-05-04 NOTE — Telephone Encounter (Signed)
Ms Sulser called today. She needs an updated note stating that she cannot return to work sent to [employer] (short term disability), fax # [phone], claim # [id number], atten: Benefit Specialist: Georga Hacking. Thanks.

## 2012-05-20 ENCOUNTER — Encounter (HOSPITAL_COMMUNITY): Payer: Self-pay | Admitting: Internal Medicine

## 2012-05-20 NOTE — Progress Notes (Signed)
Pt's form for being out of work for May and June were completed and faxed to [employer] at [phone], along with last two office notes

## 2012-05-23 ENCOUNTER — Encounter (HOSPITAL_COMMUNITY): Payer: Self-pay

## 2012-05-23 ENCOUNTER — Encounter (HOSPITAL_COMMUNITY): Payer: Self-pay | Admitting: Internal Medicine

## 2012-05-23 ENCOUNTER — Ambulatory Visit (HOSPITAL_COMMUNITY)
Admission: RE | Admit: 2012-05-23 | Discharge: 2012-05-23 | Disposition: A | Discharge status: Home / Self Care | Payer: 59 | Source: Ambulatory Visit | Attending: Internal Medicine | Admitting: Internal Medicine

## 2012-05-23 ENCOUNTER — Other Ambulatory Visit (HOSPITAL_COMMUNITY): Payer: Self-pay

## 2012-05-23 ENCOUNTER — Ambulatory Visit (HOSPITAL_COMMUNITY)
Admission: RE | Admit: 2012-05-23 | Discharge: 2012-05-23 | Disposition: A | Discharge status: Home / Self Care | Payer: 59 | Source: Ambulatory Visit | Attending: Physician Assistant | Admitting: Physician Assistant

## 2012-05-23 VITALS — BP 110/58 | HR 71 | Resp 18 | Ht 68.0 in | Wt 212.8 lb

## 2012-05-23 DIAGNOSIS — F329 Major depressive disorder, single episode, unspecified: Secondary | ICD-10-CM | POA: Insufficient documentation

## 2012-05-23 DIAGNOSIS — Z888 Allergy status to other drugs, medicaments and biological substances status: Secondary | ICD-10-CM | POA: Insufficient documentation

## 2012-05-23 DIAGNOSIS — E785 Hyperlipidemia, unspecified: Secondary | ICD-10-CM | POA: Insufficient documentation

## 2012-05-23 DIAGNOSIS — I10 Essential (primary) hypertension: Secondary | ICD-10-CM | POA: Insufficient documentation

## 2012-05-23 DIAGNOSIS — E119 Type 2 diabetes mellitus without complications: Secondary | ICD-10-CM | POA: Insufficient documentation

## 2012-05-23 DIAGNOSIS — E282 Polycystic ovarian syndrome: Secondary | ICD-10-CM | POA: Insufficient documentation

## 2012-05-23 DIAGNOSIS — I059 Rheumatic mitral valve disease, unspecified: Secondary | ICD-10-CM | POA: Insufficient documentation

## 2012-05-23 DIAGNOSIS — I379 Nonrheumatic pulmonary valve disorder, unspecified: Secondary | ICD-10-CM | POA: Insufficient documentation

## 2012-05-23 DIAGNOSIS — I509 Heart failure, unspecified: Secondary | ICD-10-CM | POA: Insufficient documentation

## 2012-05-23 DIAGNOSIS — Z87442 Personal history of urinary calculi: Secondary | ICD-10-CM | POA: Insufficient documentation

## 2012-05-23 DIAGNOSIS — I079 Rheumatic tricuspid valve disease, unspecified: Secondary | ICD-10-CM | POA: Insufficient documentation

## 2012-05-23 DIAGNOSIS — F3289 Other specified depressive episodes: Secondary | ICD-10-CM | POA: Insufficient documentation

## 2012-05-23 DIAGNOSIS — I4892 Unspecified atrial flutter: Secondary | ICD-10-CM | POA: Insufficient documentation

## 2012-05-23 DIAGNOSIS — I5022 Chronic systolic (congestive) heart failure: Secondary | ICD-10-CM

## 2012-05-23 DIAGNOSIS — E669 Obesity, unspecified: Secondary | ICD-10-CM | POA: Insufficient documentation

## 2012-05-23 DIAGNOSIS — I517 Cardiomegaly: Secondary | ICD-10-CM

## 2012-05-23 DIAGNOSIS — F411 Generalized anxiety disorder: Secondary | ICD-10-CM | POA: Insufficient documentation

## 2012-05-23 MED ORDER — CARVEDILOL 6.25 MG PO TABS: 18.7500 mg | ORAL_TABLET | Freq: Two times a day (BID) | ORAL | Status: DC

## 2012-05-23 MED ORDER — AMIODARONE HCL 200 MG PO TABS: 200.0000 mg | ORAL_TABLET | Freq: Every day | ORAL | Status: DC

## 2012-05-23 NOTE — Assessment & Plan Note (Addendum)
Attending: Maintaining sinus. Will decrease amio 200 mg daily. Continue anti-coagulation.

## 2012-05-23 NOTE — Progress Notes (Signed)
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 She has been under increased stress recently with her husband dying in the cath lab during an MI and her son getting married as well as studying for NP school.   She was admitted to [facility] in 01/2012 for acute systolic heart failure, presumed NICM, EF 25% (no cath or stress testing performed).  ProBNP 726.  Started on ACE-I, carvedilol, digoxin, and spironolactone. There was mild MR, mildly dilated LA.  TSH elevated but Free T3 and T4 within normal limits, ANA/HIV/Hepatitis panels normal.   Event monitor: 03/25/12 Frequent PVCs   A-Flutter  Last visit brief runs AFL and PVCs bigeminy. Dr Gala Romney discussed with Dr Graciela Husbands and she started Amiodarone 200 mg bid.  04/08/12 ECHO EF 30-35% LVIDd 5.6 (previous LVIDd 6.2 EF 25%)  She returns for follow up today.  She feels good.  She notices she can walk up the stairs 2-3 times and gets fatigued.  Dizziness when she bends over.  No syncope.  No edema.  Occ PVCs no palpitations.  No orthopnea/PND.  Weight is stable, has taken a couple of extra doses since last visit (after she flew).  Prelim echo EF 35-40%.    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     ROS: All systems negative except as listed in HPI, PMH and Problem List.  Past Medical History  Diagnosis Date  . PCOS (polycystic ovarian syndrome)   . Mitral valve prolapse   . Diabetes mellitus     since 2002, NIDDM on OHAS  . Kidney calculi     lithotripsy 20112 times    Current Outpatient Prescriptions  Medication Sig Dispense Refill  . ALPRAZolam (XANAX) 0.25 MG tablet Take 0.25 mg by mouth 3 (three) times daily as needed.      Marland Kitchen amiodarone (PACERONE) 200 MG tablet Take 1 tablet (200 mg total) by mouth 2 (two) times daily.  10 tablet  0  . apixaban  (ELIQUIS) 5 MG TABS tablet Take 1 tablet (5 mg total) by mouth 2 (two) times daily.  10 tablet  0  . CALCIUM-MAGNESIUM-ZINC PO Take 1 tablet by mouth daily.      . carvedilol (COREG) 6.25 MG tablet Take 12.5 mg by mouth 2 (two) times daily with a meal.      . colesevelam (WELCHOL) 625 MG tablet Take 625 mg by mouth 2 (two) times daily with a meal.      . digoxin (LANOXIN) 0.25 MG tablet Take 1 tablet (0.25 mg total) by mouth daily.  5 tablet  0  . enalapril (VASOTEC) 10 MG tablet Take 1 tablet (10 mg total) by mouth 2 (two) times daily.  10 tablet  0  . Etonogestrel-Ethinyl Estradiol (NUVARING VA) Place vaginally every 30 (thirty) days.        . fish oil-omega-3 fatty acids 1000 MG capsule Take 2 g by mouth daily. Krill oil capsule      . furosemide (LASIX) 40 MG tablet Take 1 tablet (40 mg total) by mouth daily. May take additional tablet daily for weight gain greater than 3 lbs in 24 hours  8 tablet  0  . ibuprofen (ADVIL,MOTRIN) 800 MG tablet Take 800 mg by mouth every 8 (eight) hours as needed. For pain      .  Multiple Vitamin (MULTIVITAMIN) tablet Take 1 tablet by mouth daily.      . potassium chloride SA (K-DUR,KLOR-CON) 20 MEQ tablet Take 1 tablet (20 mEq total) by mouth daily. Take additional tablet with extra lasix dose.  5 tablet  0  . rizatriptan (MAXALT) 10 MG tablet Take 10 mg by mouth as needed. May repeat in 2 hours if needed      . Saxagliptin-Metformin 2.03-999 MG TB24 Take 1 tablet by mouth 2 (two) times daily with a meal.       . sertraline (ZOLOFT) 25 MG tablet Take 25 mg by mouth daily.      Marland Kitchen spironolactone (ALDACTONE) 25 MG tablet Take 1 tablet (25 mg total) by mouth daily.  5 tablet  0  . DISCONTD: carvedilol (COREG) 6.25 MG tablet Take 1.5 tablets (9.375 mg total) by mouth 2 (two) times daily with a meal.  15 tablet  0  . DISCONTD: metFORMIN (GLUMETZA) 1000 MG (MOD) 24 hr tablet Take 1,000 mg by mouth daily with breakfast.           PHYSICAL EXAM: Filed Vitals:    05/23/12 1505  BP: 110/58  Pulse: 71  Resp: 18  Height: 5\' 8"  (1.727 m)  Weight: 212 lb 12.8 oz (96.525 kg)  SpO2: 99%    General:  Well appearing. No resp difficulty HEENT: normal Neck: supple. JVP flat. Carotids 2+ bilaterally; no bruits. No lymphadenopathy or thryomegaly appreciated. Cor: PMI normal. Regular rate & rhythm. No rubs, gallops or murmurs. Lungs: clear Abdomen: soft, nontender, nondistended. No hepatosplenomegaly. No bruits or masses. Good bowel sounds. Extremities: no cyanosis, clubbing, rash, trace edema Neuro: alert & orientedx3, cranial nerves grossly intact. Moves all 4 extremities w/o difficulty. Affect pleasant.    ASSESSMENT & PLAN:

## 2012-05-23 NOTE — Patient Instructions (Addendum)
Ok to return to work.  Try to increase carvedilol 3 tabs (18.75 mg) at night, if tolerating after 4-5 days increase the morning dose as well.    Decrease amiodarone 200 mg daily  Follow up in 2 months.

## 2012-05-23 NOTE — Assessment & Plan Note (Addendum)
Attending: Patient seen and examined with Ulyess Blossom, PA-C. We discussed all aspects of the encounter. I agree with the assessment and plan as stated above. Volume status stable.  NYHA II.  Reviewed echos, EF improving 35-40%.  Will titrate carvedilol as tolerated to 18.75 mg BID.  Continue sliding scale lasix.  With EF improving will allow Mrs. Dewitt to return to work, have provided her with a note.

## 2012-05-26 ENCOUNTER — Encounter (HOSPITAL_COMMUNITY): Payer: Self-pay

## 2012-06-15 ENCOUNTER — Other Ambulatory Visit (HOSPITAL_COMMUNITY): Payer: Self-pay | Admitting: *Deleted

## 2012-06-15 ENCOUNTER — Other Ambulatory Visit: Payer: Self-pay | Admitting: *Deleted

## 2012-06-15 ENCOUNTER — Other Ambulatory Visit: Payer: Self-pay | Admitting: Physician Assistant

## 2012-06-15 MED ORDER — CARVEDILOL 12.5 MG PO TABS: 18.7500 mg | ORAL_TABLET | Freq: Two times a day (BID) | ORAL | Status: DC

## 2012-06-15 MED ORDER — CARVEDILOL 6.25 MG PO TABS: 18.7500 mg | ORAL_TABLET | Freq: Two times a day (BID) | ORAL | Status: DC

## 2012-06-20 ENCOUNTER — Telehealth (HOSPITAL_COMMUNITY): Payer: Self-pay | Admitting: Internal Medicine

## 2012-06-20 MED ORDER — FUROSEMIDE 40 MG PO TABS: 20.0000 mg | ORAL_TABLET | Freq: Every day | ORAL | Status: DC

## 2012-06-20 NOTE — Telephone Encounter (Signed)
Per Dr Gala Romney decrease Lasix to 20 mg daily and see if this helps

## 2012-06-20 NOTE — Telephone Encounter (Signed)
Pt called concerning enalapril and wanted to have the dosage change due to it was making her dizziness.. Please call pt. Thanks.

## 2012-06-20 NOTE — Telephone Encounter (Signed)
Pt having a lot of dizziness espically when she stands, she held her enalapril over the weekend and did feel better, she is on 10 mg bid will discuss w/Dr Bensimhon and call her back

## 2012-06-20 NOTE — Telephone Encounter (Signed)
Pt aware of Dr Bensimhon's recommendation and agreeable

## 2012-06-23 ENCOUNTER — Other Ambulatory Visit (HOSPITAL_COMMUNITY): Payer: Self-pay | Admitting: Adult Health

## 2012-06-24 ENCOUNTER — Telehealth (HOSPITAL_COMMUNITY): Payer: Self-pay | Admitting: Internal Medicine

## 2012-06-24 NOTE — Telephone Encounter (Signed)
Pt states she cut her lasix in half but it has not helped her dizziness, she will decrease enalapril to 5 mg bid

## 2012-06-24 NOTE — Telephone Encounter (Signed)
Please call Ms, [name]. She would like to talk to you about her medication, Pt states she is still dizzy from taking enalapril.

## 2012-07-07 ENCOUNTER — Telehealth (HOSPITAL_COMMUNITY): Payer: Self-pay | Admitting: Vascular Surgery

## 2012-07-07 NOTE — Telephone Encounter (Signed)
Pt called she believes her blood pressure is dropping . She is very weak and dizzy . She would like to see Jesusita Oka tomorrow if possible. She would like to hear from [facility] today please. thanks

## 2012-07-07 NOTE — Telephone Encounter (Signed)
Pt scheduled for 8/9 at 8:45

## 2012-07-08 ENCOUNTER — Ambulatory Visit (HOSPITAL_COMMUNITY)
Admission: RE | Admit: 2012-07-08 | Discharge: 2012-07-08 | Disposition: A | Discharge status: Home / Self Care | Payer: 59 | Source: Ambulatory Visit | Attending: Internal Medicine | Admitting: Internal Medicine

## 2012-07-08 ENCOUNTER — Encounter (HOSPITAL_COMMUNITY): Payer: Self-pay

## 2012-07-08 ENCOUNTER — Encounter (HOSPITAL_COMMUNITY): Payer: Self-pay | Admitting: *Deleted

## 2012-07-08 VITALS — BP 109/58 | HR 72 | Ht 68.0 in | Wt 210.1 lb

## 2012-07-08 DIAGNOSIS — I951 Orthostatic hypotension: Secondary | ICD-10-CM | POA: Insufficient documentation

## 2012-07-08 DIAGNOSIS — I4892 Unspecified atrial flutter: Secondary | ICD-10-CM | POA: Insufficient documentation

## 2012-07-08 DIAGNOSIS — I5022 Chronic systolic (congestive) heart failure: Secondary | ICD-10-CM | POA: Insufficient documentation

## 2012-07-08 MED ORDER — FUROSEMIDE 40 MG PO TABS: 20.0000 mg | ORAL_TABLET | Freq: Every day | ORAL | Status: DC | PRN

## 2012-07-08 MED ORDER — FUROSEMIDE 40 MG PO TABS: 20.0000 mg | ORAL_TABLET | ORAL | Status: DC

## 2012-07-08 MED ORDER — CARVEDILOL 12.5 MG PO TABS: 12.5000 mg | ORAL_TABLET | Freq: Two times a day (BID) | ORAL | Status: DC

## 2012-07-08 NOTE — Progress Notes (Signed)
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 She has been under increased stress recently with her husband dying in the cath lab during an MI and her son getting married as well as studying for NP school.   She was admitted to [facility] in 01/2012 for acute systolic heart failure, presumed NICM, EF 25% (no cath or stress testing performed).  ProBNP 726.  Started on ACE-I, carvedilol, digoxin, and spironolactone. There was mild MR, mildly dilated LA.  TSH elevated but Free T3 and T4 within normal limits, ANA/HIV/Hepatitis panels normal.   Event monitor: 03/25/12 Frequent PVCs   A-Flutter  Last visit brief runs AFL and PVCs bigeminy. Dr Gala Romney discussed with Dr Graciela Husbands and she started Amiodarone 200 mg bid.  04/08/12 ECHO EF 30-35% LVIDd 5.6 (previous LVIDd 6.2 EF 25%) 05/23/12 Echo EF 30-35%, grade 1 diastolic dysfunction.    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       She is here for follow up on dizziness.  She is having spells where she gets hot clammy, nauseated, that makes her feel likes she is going to pass out and she has to sit down.  After one of the "spells" BP 98/68.  She denies palpitations.  She has cut back lasix 20 mg daily and enalapril to 5 mg BID without relief.  She has the spells when she  Stands for a while or when standing up no matter if she stands slowly or quickly.  Weight continues to drop 205-207 pounds.  She c/o lower extremity edema by the end of the day that is resolved in the morning.  No syncope.  No orthopnea/PND.      ROS: All systems negative except as listed in HPI, PMH and Problem List.  Past Medical History  Diagnosis Date  . PCOS (polycystic ovarian syndrome)   . Mitral valve prolapse   . Diabetes mellitus     since 2002, NIDDM on OHAS  . Kidney calculi      lithotripsy 20112 times    Current Outpatient Prescriptions  Medication Sig Dispense Refill  . ALPRAZolam (XANAX) 0.25 MG tablet Take 0.25 mg by mouth 3 (three) times daily as needed.      Marland Kitchen amiodarone (PACERONE) 200 MG tablet Take 1 tablet (200 mg total) by mouth daily.  30 tablet  3  . apixaban (ELIQUIS) 5 MG TABS tablet Take 1 tablet (5 mg total) by mouth 2 (two) times daily.  10 tablet  0  . CALCIUM-MAGNESIUM-ZINC PO Take 1 tablet by mouth daily.      . carvedilol (COREG) 12.5 MG tablet Take 1.5 tablets (18.75 mg total) by mouth 2 (two) times daily with a meal. as directed  90 tablet  6  . colesevelam (WELCHOL) 625 MG tablet Take 625 mg by mouth 2 (two) times daily with a meal.      . digoxin (LANOXIN) 0.25 MG tablet Take 1 tablet (0.25 mg total) by mouth daily.  5 tablet  0  . ELIQUIS 5 MG TABS tablet TAKE 1 TABLET BY MOUTH TWICE DAILY  60 tablet  3  . enalapril (VASOTEC) 10 MG tablet Take 5 mg by mouth 2 (two) times daily.      . Etonogestrel-Ethinyl Estradiol (NUVARING VA) Place vaginally every 30 (thirty)  days.        . fish oil-omega-3 fatty acids 1000 MG capsule Take 2 g by mouth daily. Krill oil capsule      . furosemide (LASIX) 40 MG tablet Take 0.5 tablets (20 mg total) by mouth daily. May take additional tablet daily for weight gain greater than 3 lbs in 24 hours  8 tablet  0  . ibuprofen (ADVIL,MOTRIN) 800 MG tablet Take 800 mg by mouth every 8 (eight) hours as needed. For pain      . Multiple Vitamin (MULTIVITAMIN) tablet Take 1 tablet by mouth daily.      . potassium chloride SA (K-DUR,KLOR-CON) 20 MEQ tablet Take 1 tablet (20 mEq total) by mouth daily. Take additional tablet with extra lasix dose.  5 tablet  0  . rizatriptan (MAXALT) 10 MG tablet Take 10 mg by mouth as needed. May repeat in 2 hours if needed      . Saxagliptin-Metformin 2.03-999 MG TB24 Take 1 tablet by mouth 2 (two) times daily with a meal.       . sertraline (ZOLOFT) 25 MG tablet Take 25 mg by mouth  daily.      Marland Kitchen spironolactone (ALDACTONE) 25 MG tablet Take 1 tablet (25 mg total) by mouth daily.  5 tablet  0  . DISCONTD: enalapril (VASOTEC) 10 MG tablet Take 1 tablet (10 mg total) by mouth 2 (two) times daily.  10 tablet  0  . DISCONTD: metFORMIN (GLUMETZA) 1000 MG (MOD) 24 hr tablet Take 1,000 mg by mouth daily with breakfast.           PHYSICAL EXAM: Filed Vitals:   07/08/12 0857  BP: 126/78  Pulse: 80  Height: 5\' 8"  (1.727 m)  Weight: 210 lb 1.9 oz (95.31 kg)  SpO2: 95%   Filed Vitals:   07/08/12 0857 07/08/12 0916 07/08/12 0918 07/08/12 0921  BP: 126/78 132/58 123/48 109/58  Pulse: 80 67 70 72  Height: 5\' 8"  (1.727 m)     Weight: 210 lb 1.9 oz (95.31 kg)     SpO2: 95%  96% 97%     +dizziness with standing                       Lying                   Sitting           Standing                                                                                                     General:  Well appearing. No resp difficulty HEENT: normal Neck: supple. JVP flat. Carotids 2+ bilaterally; no bruits. No lymphadenopathy or thryomegaly appreciated. Cor: PMI normal. Regular rate & rhythm. No rubs, gallops. 2/6 TR Lungs: clear Abdomen: soft, nontender, nondistended. No hepatosplenomegaly. No bruits or masses. Good bowel sounds. Extremities: no cyanosis, clubbing, rash, trace edema Neuro: alert & orientedx3, cranial nerves grossly intact. Moves all 4 extremities w/o difficulty. Affect pleasant.    ASSESSMENT & PLAN:

## 2012-07-08 NOTE — Assessment & Plan Note (Signed)
Quiescent. Continue amio and anticoagulation.

## 2012-07-08 NOTE — Addendum Note (Signed)
Encounter addended by: Noralee Space, RN on: 07/08/2012 10:10 AM<BR>     Documentation filed: Patient Instructions Section, Orders

## 2012-07-08 NOTE — Patient Instructions (Addendum)
Lasix 20 mg every other day Decrease Carvedilol to 12.5 mg Twice daily   Compression Hose  Your physician recommends that you schedule a follow-up appointment in: 3 weeks

## 2012-07-08 NOTE — Assessment & Plan Note (Signed)
Patient seen and examined with Ulyess Blossom, PA-C. We discussed all aspects of the encounter. I agree with the assessment and plan as stated above.   Attending: Doing well. NYHA I-II. Mildly orthostatic. But does have some edema. Suspect venous return is reduced. Will decrease lasix to 20 every other day. Can take extra as needed. Decrease carvedilol to 12.5 bid. Place compression hose. If symptoms not improving in 1 week. Call us. Will need repeat echo in 1-2 months.

## 2012-07-29 ENCOUNTER — Ambulatory Visit (HOSPITAL_COMMUNITY)
Admission: RE | Admit: 2012-07-29 | Discharge: 2012-07-29 | Disposition: A | Discharge status: Home / Self Care | Payer: 59 | Source: Ambulatory Visit | Attending: Anesthesiology | Admitting: Anesthesiology

## 2012-07-29 ENCOUNTER — Encounter (HOSPITAL_COMMUNITY): Payer: Self-pay

## 2012-07-29 ENCOUNTER — Ambulatory Visit (HOSPITAL_COMMUNITY)
Admission: RE | Admit: 2012-07-29 | Discharge: 2012-07-29 | Disposition: A | Discharge status: Home / Self Care | Payer: 59 | Source: Ambulatory Visit | Attending: Internal Medicine | Admitting: Internal Medicine

## 2012-07-29 VITALS — BP 143/76 | HR 85 | Ht 68.0 in | Wt 206.1 lb

## 2012-07-29 DIAGNOSIS — I509 Heart failure, unspecified: Secondary | ICD-10-CM | POA: Insufficient documentation

## 2012-07-29 DIAGNOSIS — R059 Cough, unspecified: Secondary | ICD-10-CM | POA: Insufficient documentation

## 2012-07-29 DIAGNOSIS — R05 Cough: Secondary | ICD-10-CM

## 2012-07-29 DIAGNOSIS — I5022 Chronic systolic (congestive) heart failure: Secondary | ICD-10-CM | POA: Insufficient documentation

## 2012-07-29 DIAGNOSIS — R062 Wheezing: Secondary | ICD-10-CM | POA: Insufficient documentation

## 2012-07-29 NOTE — Assessment & Plan Note (Addendum)
Patient has had persistent cough along with headache and chest congestion for two weeks. Started on doxy by PCP. Very mild crackles in lower bases of lungs. Will obtain CXR today. Continue antibiotics and tessalon pearls. Follow up with PCP if not improved. Cough not likely r/t heart failure, volume status looks good.  Attending: Suspect sx due to bronchitis. She is improving slowly. Given duration will check CXR to exclude PNA.   Addendum: CXR clear. Cardiomegaly has resolved since 3/13.

## 2012-07-29 NOTE — Assessment & Plan Note (Addendum)
NYHA II. Volume status good. Continue medications as prescribed. BP slightly elevated, but most likely r/t bronchitis/virus. Will monitor when she returns.  Patient seen and examined with [name], NP. We discussed all aspects of the encounter. I agree with the assessment and plan as stated above. She continues to improve from a HF standpoint. No evidence of volume overload. Suspect current symptoms due to URI/bronchitis. Will check CXR.

## 2012-07-29 NOTE — Progress Notes (Signed)
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 She has been under increased stress recently with her husband dying in the cath lab during an MI and her son getting married as well as studying for NP school.   She was admitted to [facility] in 01/2012 for acute systolic heart failure, presumed NICM, EF 25% (no cath or stress testing performed).  ProBNP 726.  Started on ACE-I, carvedilol, digoxin, and spironolactone. There was mild MR, mildly dilated LA.  TSH elevated but Free T3 and T4 within normal limits, ANA/HIV/Hepatitis panels normal.   Event monitor: 03/25/12 Frequent PVCs   A-Flutter  Last visit brief runs AFL and PVCs bigeminy. Dr Gala Romney discussed with Dr Graciela Husbands and she started Amiodarone 200 mg bid.  04/08/12 ECHO EF 30-35% LVIDd 5.6 (previous LVIDd 6.2 EF 25%) 05/23/12 Echo EF 30-35%, grade 1 diastolic dysfunction.    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 Head cold and coughing constantly. Not coughing anything up. Lots of nasal mucous, which is clear. PCP started her on doxy on Wednesday and Tessalon perles, however not improving. Weight stable. +Headache. Denies SOB/CP/orhtopnea.   ROS: All systems negative except as listed in HPI, PMH and Problem List.  Past Medical History  Diagnosis Date  . PCOS (polycystic ovarian syndrome)   . Mitral valve prolapse   . Diabetes mellitus     since 2002, NIDDM on OHAS  . Kidney calculi     lithotripsy 20112 times    Current Outpatient Prescriptions  Medication Sig Dispense Refill  . ALPRAZolam (XANAX) 0.25 MG tablet Take 0.25 mg by mouth 3 (three) times daily as needed.      Marland Kitchen amiodarone (PACERONE) 200 MG tablet Take 1 tablet (200 mg total) by mouth daily.  30 tablet  3  . apixaban (ELIQUIS) 5 MG TABS  tablet Take 1 tablet (5 mg total) by mouth 2 (two) times daily.  10 tablet  0  . carvedilol (COREG) 12.5 MG tablet Take 1 tablet (12.5 mg total) by mouth 2 (two) times daily with a meal. as directed  90 tablet  6  . colesevelam (WELCHOL) 625 MG tablet Take 625 mg by mouth 2 (two) times daily with a meal.      . digoxin (LANOXIN) 0.25 MG tablet Take 1 tablet (0.25 mg total) by mouth daily.  5 tablet  0  . enalapril (VASOTEC) 10 MG tablet Take 5 mg by mouth 2 (two) times daily.      . Etonogestrel-Ethinyl Estradiol (NUVARING VA) Place vaginally every 30 (thirty) days.        . fish oil-omega-3 fatty acids 1000 MG capsule Take 2 g by mouth daily. Krill oil capsule      . furosemide (LASIX) 40 MG tablet Take 0.5 tablets (20 mg total) by mouth every other day. May take additional tablet daily for weight gain greater than 3 lbs in 24 hours  8 tablet  0  . ibuprofen (ADVIL,MOTRIN) 800 MG tablet Take 800 mg by mouth every 8 (eight) hours as needed. For pain      . levothyroxine (SYNTHROID, LEVOTHROID) 75 MCG tablet Take 75 mcg by mouth daily.      . Multiple Vitamin (MULTIVITAMIN) tablet Take 1 tablet by mouth daily.      Marland Kitchen  potassium chloride SA (K-DUR,KLOR-CON) 20 MEQ tablet Take 1 tablet (20 mEq total) by mouth daily. Take additional tablet with extra lasix dose.  5 tablet  0  . pravastatin (PRAVACHOL) 10 MG tablet Take 10 mg by mouth daily.      . rizatriptan (MAXALT) 10 MG tablet Take 10 mg by mouth as needed. May repeat in 2 hours if needed      . Saxagliptin-Metformin 2.03-999 MG TB24 Take 1 tablet by mouth 2 (two) times daily with a meal.       . sertraline (ZOLOFT) 25 MG tablet Take 25 mg by mouth daily.      Marland Kitchen spironolactone (ALDACTONE) 25 MG tablet Take 1 tablet (25 mg total) by mouth daily.  5 tablet  0  . DISCONTD: ELIQUIS 5 MG TABS tablet TAKE 1 TABLET BY MOUTH TWICE DAILY  60 tablet  3  . DISCONTD: metFORMIN (GLUMETZA) 1000 MG (MOD) 24 hr tablet Take 1,000 mg by mouth daily with breakfast.            PHYSICAL EXAM: Filed Vitals:   07/29/12 1047  BP: 143/76  Pulse: 85  Height: 5\' 8"  (1.727 m)  Weight: 206 lb 1.9 oz (93.495 kg)  SpO2: 97%   Filed Vitals:   07/29/12 1047  BP: 143/76  Pulse: 85  Height: 5\' 8"  (1.727 m)  Weight: 206 lb 1.9 oz (93.495 kg)  SpO2: 97%                                                                    General: Ill appearing. + cough HEENT: normal Neck: supple. JVP 5-6. Carotids 2+ bilaterally; no bruits. No lymphadenopathy or thryomegaly appreciated. Cor: PMI normal. Regular rate & rhythm. No rubs, gallops. 2/6 TR, +S4 Lungs: + crackles lower bases Abdomen: soft, nontender, nondistended. No hepatosplenomegaly. No bruits or masses. Good bowel sounds. Extremities: no cyanosis, clubbing, rash, trace edema Neuro: alert & orientedx3, cranial nerves grossly intact. Moves all 4 extremities w/o difficulty. Affect pleasant.    ASSESSMENT & PLAN:

## 2012-07-29 NOTE — Patient Instructions (Addendum)
Will call with PCXR results.   Continue antibiotic.

## 2012-08-03 ENCOUNTER — Other Ambulatory Visit: Payer: Self-pay | Admitting: *Deleted

## 2012-08-03 MED ORDER — DIGOXIN 250 MCG PO TABS: 0.2500 mg | ORAL_TABLET | Freq: Every day | ORAL | Status: DC

## 2012-08-03 MED ORDER — SPIRONOLACTONE 25 MG PO TABS: 25.0000 mg | ORAL_TABLET | Freq: Every day | ORAL | Status: DC

## 2012-08-03 NOTE — Telephone Encounter (Signed)
Refilled spironolactone and digoxin

## 2012-08-08 ENCOUNTER — Ambulatory Visit (HOSPITAL_COMMUNITY)
Admission: RE | Admit: 2012-08-08 | Discharge: 2012-08-08 | Disposition: A | Discharge status: Home / Self Care | Payer: 59 | Source: Ambulatory Visit | Attending: Internal Medicine | Admitting: Internal Medicine

## 2012-08-08 ENCOUNTER — Ambulatory Visit (HOSPITAL_BASED_OUTPATIENT_CLINIC_OR_DEPARTMENT_OTHER)
Admission: RE | Admit: 2012-08-08 | Discharge: 2012-08-08 | Disposition: A | Discharge status: Home / Self Care | Payer: 59 | Source: Ambulatory Visit | Attending: Internal Medicine | Admitting: Internal Medicine

## 2012-08-08 ENCOUNTER — Encounter (HOSPITAL_COMMUNITY): Payer: Self-pay

## 2012-08-08 VITALS — BP 118/62 | HR 74 | Ht 68.0 in | Wt 209.0 lb

## 2012-08-08 DIAGNOSIS — I5022 Chronic systolic (congestive) heart failure: Secondary | ICD-10-CM

## 2012-08-08 DIAGNOSIS — E119 Type 2 diabetes mellitus without complications: Secondary | ICD-10-CM | POA: Insufficient documentation

## 2012-08-08 DIAGNOSIS — I059 Rheumatic mitral valve disease, unspecified: Secondary | ICD-10-CM | POA: Insufficient documentation

## 2012-08-08 DIAGNOSIS — I369 Nonrheumatic tricuspid valve disorder, unspecified: Secondary | ICD-10-CM | POA: Insufficient documentation

## 2012-08-08 DIAGNOSIS — I4892 Unspecified atrial flutter: Secondary | ICD-10-CM

## 2012-08-08 DIAGNOSIS — I379 Nonrheumatic pulmonary valve disorder, unspecified: Secondary | ICD-10-CM | POA: Insufficient documentation

## 2012-08-08 DIAGNOSIS — I5020 Unspecified systolic (congestive) heart failure: Secondary | ICD-10-CM | POA: Insufficient documentation

## 2012-08-08 MED ORDER — CARVEDILOL 12.5 MG PO TABS: 18.7500 mg | ORAL_TABLET | Freq: Two times a day (BID) | ORAL | Status: DC

## 2012-08-08 NOTE — Progress Notes (Signed)
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 She has been under increased stress recently with her husband dying in the cath lab during an MI and her son getting married as well as studying for NP school.   She was admitted to [facility] in 01/2012 for acute systolic heart failure, presumed NICM, EF 25% (no cath or stress testing performed).  ProBNP 726.  Started on ACE-I, carvedilol, digoxin, and spironolactone. There was mild MR, mildly dilated LA.  TSH elevated but Free T3 and T4 within normal limits, ANA/HIV/Hepatitis panels normal.   Event monitor: 03/25/12 Frequent PVCs   A-Flutter  Last visit brief runs AFL and PVCs bigeminy. Dr Gala Romney discussed with Dr Graciela Husbands and she started Amiodarone 200 mg bid.  Echos: 04/08/12 EF 30-35% LVIDd 5.6 (previous LVIDd 6.2 EF 25%) 05/23/12 EF 30-35%, grade 1 diastolic dysfunction.   08/08/12 EF 35%  She returns for follow up today.  She is doing much better.  Bronchitis resolved.  Denies dyspnea, orthopnea, PND.  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   Denies dizziness.  Has not required extra lasix.  No ches tpain    ROS: All systems negative except as listed in HPI, PMH and Problem List.  Past Medical History  Diagnosis Date  . PCOS (polycystic ovarian syndrome)   . Mitral valve prolapse   . Diabetes mellitus     since 2002, NIDDM on OHAS  . Kidney calculi     lithotripsy 20112 times    Current Outpatient Prescriptions  Medication Sig Dispense Refill  . ALPRAZolam (XANAX) 0.25 MG tablet Take 0.25 mg by mouth 3 (three) times daily as needed.      Marland Kitchen amiodarone (PACERONE) 200 MG tablet Take 1 tablet (200 mg total) by mouth daily.  30 tablet  3  . apixaban (ELIQUIS) 5 MG TABS tablet Take 1 tablet (5 mg total) by mouth 2 (two) times daily.  10 tablet  0  . carvedilol (COREG) 12.5 MG  tablet Take 1 tablet (12.5 mg total) by mouth 2 (two) times daily with a meal. as directed  90 tablet  6  . colesevelam (WELCHOL) 625 MG tablet Take 625 mg by mouth 2 (two) times daily with a meal.      . digoxin (LANOXIN) 0.25 MG tablet Take 1 tablet (0.25 mg total) by mouth daily.  5 tablet  0  . enalapril (VASOTEC) 10 MG tablet Take 5 mg by mouth 2 (two) times daily.      . Etonogestrel-Ethinyl Estradiol (NUVARING VA) Place vaginally every 30 (thirty) days.        . fish oil-omega-3 fatty acids 1000 MG capsule Take 2 g by mouth daily. Krill oil capsule      . furosemide (LASIX) 40 MG tablet Take 0.5 tablets (20 mg total) by mouth every other day. May take additional tablet daily for weight gain greater than 3 lbs in 24 hours  8 tablet  0  . ibuprofen (ADVIL,MOTRIN) 800 MG tablet Take 800 mg by mouth every 8 (eight) hours as needed. For pain      . levothyroxine (SYNTHROID, LEVOTHROID) 75 MCG tablet Take 75 mcg by mouth daily.      . Multiple Vitamin (MULTIVITAMIN) tablet Take 1 tablet by mouth daily.      . potassium chloride SA (K-DUR,KLOR-CON)  20 MEQ tablet Take 1 tablet (20 mEq total) by mouth daily. Take additional tablet with extra lasix dose.  5 tablet  0  . pravastatin (PRAVACHOL) 10 MG tablet Take 10 mg by mouth daily.      . rizatriptan (MAXALT) 10 MG tablet Take 10 mg by mouth as needed. May repeat in 2 hours if needed      . Saxagliptin-Metformin 2.03-999 MG TB24 Take 1 tablet by mouth 2 (two) times daily with a meal.       . sertraline (ZOLOFT) 25 MG tablet Take 25 mg by mouth daily.      Marland Kitchen spironolactone (ALDACTONE) 25 MG tablet Take 1 tablet (25 mg total) by mouth daily.  5 tablet  0  . DISCONTD: metFORMIN (GLUMETZA) 1000 MG (MOD) 24 hr tablet Take 1,000 mg by mouth daily with breakfast.           PHYSICAL EXAM:  Filed Vitals:   08/08/12 0931  BP: 118/62  Pulse: 74  Height: 5\' 8"  (1.727 m)  Weight: 209 lb (94.802 kg)  SpO2: 97%                                                                     General: Well appearing, NAD HEENT: normal Neck: supple. JVP flat. Carotids 2+ bilaterally; no bruits. No lymphadenopathy or thryomegaly appreciated. Cor: PMI normal. Regular rate & rhythm. No rubs, gallops. 2/6 TR, +S4 Lungs: rhonchi upper lobe Abdomen: soft, nontender, nondistended. No hepatosplenomegaly. No bruits or masses. Good bowel sounds. Extremities: no cyanosis, clubbing, rash, trace edema Neuro: alert & orientedx3, cranial nerves grossly intact. Moves all 4 extremities w/o difficulty. Affect pleasant.    ASSESSMENT & PLAN:

## 2012-08-08 NOTE — Addendum Note (Signed)
Encounter addended by: Noralee Space, RN on: 08/08/2012 10:41 AM<BR>     Documentation filed: Patient Instructions Section, Orders

## 2012-08-08 NOTE — Progress Notes (Signed)
  Echocardiogram 2D Echocardiogram has been performed.  [name] 08/08/2012, 10:44 AM

## 2012-08-08 NOTE — Assessment & Plan Note (Addendum)
Maintaining SR on amio. Decrease to 100 daily. As EF increases may consider stopping amio and considering ablation if it recurs.

## 2012-08-08 NOTE — Assessment & Plan Note (Addendum)
Patient seen and examined with Ulyess Blossom, PA-C. We discussed all aspects of the encounter. I agree with the assessment and plan as stated above.   Doing well. NYHA I-II. Echo reviewed personally in clinic and EF ~35%. Volume status looks good. We discussed ICD but both feel it is best to hold off at this point particularly given her excellent functional status. Will continue to increase carvedilol to 18.75 bid (as tolerated - had to cut back previously). Reinforced need for daily weights and reviewed use of sliding scale diuretics. Can consider MRI in future to clearly assess EF as needed.

## 2012-08-08 NOTE — Patient Instructions (Addendum)
Increase carvedilol 1.5 tabs (18.75 mg) twice daily.    Your physician recommends that you schedule a follow-up appointment in: 6 weeks/hms

## 2012-09-16 ENCOUNTER — Other Ambulatory Visit (HOSPITAL_COMMUNITY): Payer: Self-pay | Admitting: Physician Assistant

## 2012-09-23 ENCOUNTER — Ambulatory Visit (HOSPITAL_COMMUNITY): Payer: 59

## 2012-10-11 ENCOUNTER — Ambulatory Visit (HOSPITAL_COMMUNITY)
Admission: RE | Admit: 2012-10-11 | Discharge: 2012-10-11 | Disposition: A | Discharge status: Home / Self Care | Payer: 59 | Source: Ambulatory Visit | Attending: Internal Medicine | Admitting: Internal Medicine

## 2012-10-11 VITALS — BP 138/76 | HR 67 | Wt 212.0 lb

## 2012-10-11 DIAGNOSIS — I5022 Chronic systolic (congestive) heart failure: Secondary | ICD-10-CM

## 2012-10-11 DIAGNOSIS — I4949 Other premature depolarization: Secondary | ICD-10-CM | POA: Insufficient documentation

## 2012-10-11 DIAGNOSIS — F329 Major depressive disorder, single episode, unspecified: Secondary | ICD-10-CM | POA: Insufficient documentation

## 2012-10-11 DIAGNOSIS — I4892 Unspecified atrial flutter: Secondary | ICD-10-CM

## 2012-10-11 DIAGNOSIS — E282 Polycystic ovarian syndrome: Secondary | ICD-10-CM | POA: Insufficient documentation

## 2012-10-11 DIAGNOSIS — E119 Type 2 diabetes mellitus without complications: Secondary | ICD-10-CM | POA: Insufficient documentation

## 2012-10-11 DIAGNOSIS — F3289 Other specified depressive episodes: Secondary | ICD-10-CM | POA: Insufficient documentation

## 2012-10-11 DIAGNOSIS — F411 Generalized anxiety disorder: Secondary | ICD-10-CM | POA: Insufficient documentation

## 2012-10-11 DIAGNOSIS — I059 Rheumatic mitral valve disease, unspecified: Secondary | ICD-10-CM | POA: Insufficient documentation

## 2012-10-11 DIAGNOSIS — E669 Obesity, unspecified: Secondary | ICD-10-CM | POA: Insufficient documentation

## 2012-10-11 DIAGNOSIS — I498 Other specified cardiac arrhythmias: Secondary | ICD-10-CM | POA: Insufficient documentation

## 2012-10-11 MED ORDER — CARVEDILOL 25 MG PO TABS: 25.0000 mg | ORAL_TABLET | Freq: Two times a day (BID) | ORAL | Status: DC

## 2012-10-11 NOTE — Patient Instructions (Signed)
Take carvedilol 25 mg twice a day  Do the following things EVERYDAY: 1) Weigh yourself in the morning before breakfast. Write it down and keep it in a log. 2) Take your medicines as prescribed 3) Eat low salt foods-Limit salt (sodium) to 2000 mg per day.  4) Stay as active as you can everyday 5) Limit all fluids for the day to less than 2 liters  Follow up in 3 months

## 2012-10-11 NOTE — Assessment & Plan Note (Addendum)
NYHA I. Volume status stable. Increase carvedilol to 25 mg twice a day. Encouraged to start walking at least 15 minutes daily. Follow up in 3 months.

## 2012-10-11 NOTE — Progress Notes (Signed)
Patient ID: [name], female   DOB: Apr 03, 1961, 51 y.o.   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 She has been under increased stress recently with her husband dying in the cath lab during an MI and her son getting married as well as studying for NP school.   She was admitted to [facility] in 01/2012 for acute systolic heart failure, presumed NICM, EF 25% (no cath or stress testing performed).  ProBNP 726.  Started on ACE-I, carvedilol, digoxin, and spironolactone. There was mild MR, mildly dilated LA.  TSH elevated but Free T3 and T4 within normal limits, ANA/HIV/Hepatitis panels normal.   Event monitor: 03/25/12 Frequent PVCs   A-Flutter  Last visit brief runs AFL and PVCs bigeminy. Dr Gala Romney discussed with Dr Graciela Husbands and she started Amiodarone 200 mg bid.  Echos: 04/08/12 EF 30-35% LVIDd 5.6 (previous LVIDd 6.2 EF 25%) 05/23/12 EF 30-35%, grade 1 diastolic dysfunction.   08/08/12 EF 35%  She returns for follow up today.  Last visit carvedilol increased to 18.75 mg bid and amiodarone decreased to 100 mg daily. Denies SOB/PND/Orthopnea/CP. No palpitations. Compliant with medications. Denies lower extremity edema. Finishing NP school next April. 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 Tries to walk some but not on regular basis.    ROS: All systems negative except as listed in HPI, PMH and Problem List.  Past Medical History  Diagnosis Date  . PCOS (polycystic ovarian syndrome)   . Mitral valve prolapse   . Diabetes mellitus     since 2002, NIDDM on OHAS  . Kidney calculi     lithotripsy 20112 times    Current Outpatient Prescriptions  Medication Sig Dispense Refill  . ALPRAZolam (XANAX) 0.25 MG tablet Take 0.25 mg by mouth 3 (three) times daily as needed.      Marland Kitchen amiodarone (PACERONE) 200 MG tablet Take 1 tablet (200  mg total) by mouth daily.  30 tablet  3  . apixaban (ELIQUIS) 5 MG TABS tablet Take 1 tablet (5 mg total) by mouth 2 (two) times daily.  10 tablet  0  . carvedilol (COREG) 12.5 MG tablet Take 1.5 tablets (18.75 mg total) by mouth 2 (two) times daily with a meal. as directed  90 tablet  6  . colesevelam (WELCHOL) 625 MG tablet Take 625 mg by mouth 2 (two) times daily with a meal.      . digoxin (LANOXIN) 0.25 MG tablet Take 1 tablet (0.25 mg total) by mouth daily.  5 tablet  0  . digoxin (LANOXIN) 0.25 MG tablet TAKE 1 TABLET BY MOUTH DAILY.  30 tablet  PRN  . enalapril (VASOTEC) 10 MG tablet Take 5 mg by mouth 2 (two) times daily.      . Etonogestrel-Ethinyl Estradiol (NUVARING VA) Place vaginally every 30 (thirty) days.        . fish oil-omega-3 fatty acids 1000 MG capsule Take 2 g by mouth daily. Krill oil capsule      . furosemide (LASIX) 40 MG tablet Take 0.5 tablets (20 mg total) by mouth every other day. May take additional tablet daily for weight gain greater than 3 lbs in 24 hours  8 tablet  0  . ibuprofen (ADVIL,MOTRIN) 800 MG tablet Take 800 mg by mouth every 8 (eight) hours as needed. For pain      .  levothyroxine (SYNTHROID, LEVOTHROID) 75 MCG tablet Take 75 mcg by mouth daily.      . Multiple Vitamin (MULTIVITAMIN) tablet Take 1 tablet by mouth daily.      . potassium chloride SA (K-DUR,KLOR-CON) 20 MEQ tablet Take 1 tablet (20 mEq total) by mouth daily. Take additional tablet with extra lasix dose.  5 tablet  0  . pravastatin (PRAVACHOL) 10 MG tablet Take 10 mg by mouth daily.      . rizatriptan (MAXALT) 10 MG tablet Take 10 mg by mouth as needed. May repeat in 2 hours if needed      . Saxagliptin-Metformin 2.03-999 MG TB24 Take 1 tablet by mouth 2 (two) times daily with a meal.       . sertraline (ZOLOFT) 25 MG tablet Take 25 mg by mouth daily.      Marland Kitchen spironolactone (ALDACTONE) 25 MG tablet Take 1 tablet (25 mg total) by mouth daily.  5 tablet  0  . spironolactone (ALDACTONE) 25 MG  tablet TAKE 1 TABLET BY MOUTH DAILY.  30 tablet  PRN  . [DISCONTINUED] metFORMIN (GLUMETZA) 1000 MG (MOD) 24 hr tablet Take 1,000 mg by mouth daily with breakfast.           PHYSICAL EXAM:  Filed Vitals:   10/11/12 0858  BP: 138/76  Pulse: 67  Weight: 212 lb (96.163 kg)  SpO2: 99%                                                                    General: Well appearing, NAD HEENT: normal Neck: supple. JVP flat. Carotids 2+ bilaterally; no bruits. No lymphadenopathy or thryomegaly appreciated. Cor: PMI normal. Regular rate & rhythm. No rubs, gallops. 2/6 TR, +S4 Lungs: rhonchi upper lobe Abdomen: soft, nontender, nondistended. No hepatosplenomegaly. No bruits or masses. Good bowel sounds. Extremities: no cyanosis, clubbing, rash, edema Neuro: alert & orientedx3, cranial nerves grossly intact. Moves all 4 extremities w/o difficulty. Affect pleasant.    ASSESSMENT & PLAN:

## 2012-10-11 NOTE — Assessment & Plan Note (Addendum)
Rate controlled. No further palpitations noted. Continue amiodarone at 100 mg daily.

## 2012-11-09 ENCOUNTER — Other Ambulatory Visit: Payer: Self-pay | Admitting: Gynecology

## 2012-11-24 ENCOUNTER — Other Ambulatory Visit (HOSPITAL_COMMUNITY): Payer: Self-pay | Admitting: Adult Health

## 2012-12-16 ENCOUNTER — Other Ambulatory Visit (HOSPITAL_COMMUNITY): Payer: Self-pay | Admitting: Physician Assistant

## 2012-12-16 ENCOUNTER — Other Ambulatory Visit (HOSPITAL_COMMUNITY): Payer: Self-pay | Admitting: Adult Health

## 2012-12-16 DIAGNOSIS — I5022 Chronic systolic (congestive) heart failure: Secondary | ICD-10-CM

## 2013-01-24 ENCOUNTER — Telehealth (HOSPITAL_COMMUNITY): Payer: Self-pay | Admitting: Cardiology

## 2013-01-24 ENCOUNTER — Encounter (HOSPITAL_COMMUNITY): Payer: Self-pay | Admitting: Pharmacy Technician

## 2013-01-24 ENCOUNTER — Encounter (HOSPITAL_BASED_OUTPATIENT_CLINIC_OR_DEPARTMENT_OTHER): Payer: Self-pay | Admitting: *Deleted

## 2013-01-24 MED ORDER — LACTATED RINGERS IV SOLN: INTRAVENOUS | Status: DC

## 2013-01-24 MED ORDER — FENTANYL CITRATE 0.05 MG/ML IJ SOLN: 50.0000 ug | INTRAMUSCULAR | Status: DC | PRN

## 2013-01-24 MED ORDER — MIDAZOLAM HCL 2 MG/2ML IJ SOLN: 1.0000 mg | INTRAMUSCULAR | Status: DC | PRN

## 2013-01-24 MED ORDER — CEFAZOLIN SODIUM-DEXTROSE 2-3 GM-% IV SOLR: 2.0000 g | INTRAVENOUS | Status: DC

## 2013-01-24 NOTE — Progress Notes (Signed)
Called dr benshimons nurse to fu on clearance and if pt needs telemety.

## 2013-01-24 NOTE — Progress Notes (Signed)
Pt works at [employer] is funtioning well with chf-no sob, Will need istat in am-to bring all meds and overnight bag She is aware there is a call into cardiology for clearance and if she needs telemetry post op. If so, will need to be done short stay. preop instructions reviewed.

## 2013-01-24 NOTE — Telephone Encounter (Signed)
Pt called to let us know she will be having an extensive wrist surgery tomorrow 01/25/13. Is there anything she should tell the surgeon prior to surgery?. Please call

## 2013-01-24 NOTE — Progress Notes (Signed)
Reviewed case with Dr Lillia Pauls to pts mvp and chf ef 30-35%-and Dr Orlan Leavens wanting her to stay overnight post op-Called dr Gala Romney to see about cardiac clearance and did she need telemetry post op-If so-she needs to have her surgery in the main or.

## 2013-01-24 NOTE — Telephone Encounter (Signed)
[name] with the day surgery ctr called to request cardiac clearance for her surgery 01/25/13, Pt will have ORAF with at least 2.5 hrs general anesthesia. -is she cleared for surgery from a cardiac stand point? -post operatively is it all right for pt to go to a telemetry unit?  If she is not available have [place] paged at [phone]

## 2013-01-24 NOTE — Telephone Encounter (Signed)
Per Dr Gala Romney pt is cleared for surgery and should be on tele, Karin Golden is aware, have also left mess for pt to let her know

## 2013-01-25 ENCOUNTER — Encounter (HOSPITAL_COMMUNITY): Payer: Self-pay | Admitting: Anesthesiology

## 2013-01-25 ENCOUNTER — Observation Stay (HOSPITAL_BASED_OUTPATIENT_CLINIC_OR_DEPARTMENT_OTHER)
Admission: RE | Admit: 2013-01-25 | Discharge: 2013-01-26 | Disposition: A | Discharge status: Home / Self Care | Payer: 59 | Source: Ambulatory Visit | Attending: Orthopedic Surgery | Admitting: Orthopedic Surgery

## 2013-01-25 ENCOUNTER — Encounter (HOSPITAL_COMMUNITY)
Admission: RE | Disposition: A | Discharge status: Home / Self Care | Payer: Self-pay | Source: Ambulatory Visit | Attending: Orthopedic Surgery

## 2013-01-25 ENCOUNTER — Ambulatory Visit (HOSPITAL_COMMUNITY): Payer: 59 | Admitting: Anesthesiology

## 2013-01-25 DIAGNOSIS — Y92009 Unspecified place in unspecified non-institutional (private) residence as the place of occurrence of the external cause: Secondary | ICD-10-CM | POA: Insufficient documentation

## 2013-01-25 DIAGNOSIS — S52599A Other fractures of lower end of unspecified radius, initial encounter for closed fracture: Principal | ICD-10-CM | POA: Insufficient documentation

## 2013-01-25 DIAGNOSIS — I4891 Unspecified atrial fibrillation: Secondary | ICD-10-CM | POA: Insufficient documentation

## 2013-01-25 DIAGNOSIS — W19XXXA Unspecified fall, initial encounter: Secondary | ICD-10-CM | POA: Insufficient documentation

## 2013-01-25 DIAGNOSIS — E119 Type 2 diabetes mellitus without complications: Secondary | ICD-10-CM | POA: Insufficient documentation

## 2013-01-25 DIAGNOSIS — I10 Essential (primary) hypertension: Secondary | ICD-10-CM | POA: Insufficient documentation

## 2013-01-25 DIAGNOSIS — I509 Heart failure, unspecified: Secondary | ICD-10-CM | POA: Insufficient documentation

## 2013-01-25 HISTORY — DX: Hypothyroidism, unspecified: E03.9

## 2013-01-25 HISTORY — DX: Cardiac arrhythmia, unspecified: I49.9

## 2013-01-25 HISTORY — DX: Essential (primary) hypertension: I10

## 2013-01-25 HISTORY — DX: Depression, unspecified: F32.A

## 2013-01-25 HISTORY — DX: Major depressive disorder, single episode, unspecified: F32.9

## 2013-01-25 HISTORY — DX: Heart failure, unspecified: I50.9

## 2013-01-25 HISTORY — DX: Left bundle-branch block, unspecified: I44.7

## 2013-01-25 HISTORY — PX: ORIF WRIST FRACTURE: SHX2133

## 2013-01-25 HISTORY — DX: Anxiety disorder, unspecified: F41.9

## 2013-01-25 HISTORY — DX: Cardiac murmur, unspecified: R01.1

## 2013-01-25 LAB — CBC
HCT: 34 % — ABNORMAL LOW (ref 36.0–46.0)
Hemoglobin: 11.5 g/dL — ABNORMAL LOW (ref 12.0–15.0)
MCH: 28.9 pg (ref 26.0–34.0)
MCHC: 33.8 g/dL (ref 30.0–36.0)
RBC: 3.98 MIL/uL (ref 3.87–5.11)

## 2013-01-25 LAB — BASIC METABOLIC PANEL
BUN: 13 mg/dL (ref 6–23)
CO2: 22 mEq/L (ref 19–32)
GFR calc non Af Amer: >90 mL/min (ref >90)
Glucose, Bld: 171 mg/dL — ABNORMAL HIGH (ref 70–99)
Potassium: 3.9 mEq/L (ref 3.5–5.1)

## 2013-01-25 LAB — SURGICAL PCR SCREEN: Staphylococcus aureus: NEGATIVE

## 2013-01-25 LAB — GLUCOSE, CAPILLARY
Glucose-Capillary: 123 mg/dL — ABNORMAL HIGH (ref 70–99)
Glucose-Capillary: 188 mg/dL — ABNORMAL HIGH (ref 70–99)

## 2013-01-25 LAB — PROTIME-INR
INR: 0.99 (ref 0.00–1.49)
Prothrombin Time: 13 seconds (ref 11.6–15.2)

## 2013-01-25 LAB — APTT: aPTT: 26 seconds (ref 24–37)

## 2013-01-25 SURGERY — OPEN REDUCTION INTERNAL FIXATION (ORIF) WRIST FRACTURE
Anesthesia: General | Site: Wrist | Laterality: Left

## 2013-01-25 SURGERY — OPEN REDUCTION INTERNAL FIXATION (ORIF) WRIST FRACTURE
Anesthesia: General | Site: Wrist | Laterality: Left | Wound class: Clean

## 2013-01-25 MED ORDER — LACTATED RINGERS IV SOLN
INTRAVENOUS | Status: DC | PRN
  Administered 2013-01-25 (×2): via INTRAVENOUS

## 2013-01-25 MED ORDER — SAXAGLIPTIN-METFORMIN ER 2.5-1000 MG PO TB24: 1.0000 | ORAL_TABLET | Freq: Two times a day (BID) | ORAL | Status: DC

## 2013-01-25 MED ORDER — CEFAZOLIN SODIUM 1-5 GM-% IV SOLN
1.0000 g | Freq: Three times a day (TID) | INTRAVENOUS | Status: DC
  Administered 2013-01-26 (×3): 1 g via INTRAVENOUS
  Filled 2013-01-25 (×4): qty 50

## 2013-01-25 MED ORDER — SPIRONOLACTONE 25 MG PO TABS
25.0000 mg | ORAL_TABLET | Freq: Every day | ORAL | Status: DC
  Administered 2013-01-26: 25 mg via ORAL
  Filled 2013-01-25: qty 1

## 2013-01-25 MED ORDER — ALPRAZOLAM 0.25 MG PO TABS: 0.2500 mg | ORAL_TABLET | Freq: Three times a day (TID) | ORAL | Status: DC | PRN

## 2013-01-25 MED ORDER — VITAMIN C 500 MG PO TABS
1000.0000 mg | ORAL_TABLET | Freq: Every day | ORAL | Status: DC
  Administered 2013-01-25 – 2013-01-26 (×2): 1000 mg via ORAL
  Filled 2013-01-25 (×2): qty 2

## 2013-01-25 MED ORDER — LACTATED RINGERS IV SOLN
INTRAVENOUS | Status: DC
  Administered 2013-01-25: 11:00:00 via INTRAVENOUS

## 2013-01-25 MED ORDER — MUPIROCIN 2 % EX OINT
TOPICAL_OINTMENT | Freq: Two times a day (BID) | CUTANEOUS | Status: DC
  Administered 2013-01-25: 22:00:00 via NASAL
  Administered 2013-01-25: 1 via NASAL
  Administered 2013-01-26: 11:00:00 via NASAL
  Filled 2013-01-25 (×2): qty 22

## 2013-01-25 MED ORDER — CEFAZOLIN SODIUM-DEXTROSE 2-3 GM-% IV SOLR
INTRAVENOUS | Status: AC
  Administered 2013-01-25: 2 g via INTRAVENOUS
  Filled 2013-01-25: qty 50

## 2013-01-25 MED ORDER — POTASSIUM CHLORIDE CRYS ER 20 MEQ PO TBCR
20.0000 meq | EXTENDED_RELEASE_TABLET | Freq: Every day | ORAL | Status: DC
  Administered 2013-01-26: 20 meq via ORAL
  Filled 2013-01-25 (×3): qty 1

## 2013-01-25 MED ORDER — HYDROMORPHONE HCL PF 1 MG/ML IJ SOLN
0.5000 mg | INTRAMUSCULAR | Status: DC | PRN
  Administered 2013-01-25 – 2013-01-26 (×6): 1 mg via INTRAVENOUS
  Filled 2013-01-25 (×6): qty 1

## 2013-01-25 MED ORDER — METFORMIN HCL 500 MG PO TABS
1000.0000 mg | ORAL_TABLET | Freq: Two times a day (BID) | ORAL | Status: DC
  Administered 2013-01-26 (×2): 1000 mg via ORAL
  Filled 2013-01-25 (×3): qty 2

## 2013-01-25 MED ORDER — CEFAZOLIN SODIUM 1-5 GM-% IV SOLN
1.0000 g | INTRAVENOUS | Status: AC
  Administered 2013-01-25: 1 g via INTRAVENOUS
  Filled 2013-01-25: qty 50

## 2013-01-25 MED ORDER — HYDROMORPHONE HCL PF 1 MG/ML IJ SOLN
0.2500 mg | INTRAMUSCULAR | Status: DC | PRN
  Administered 2013-01-25 (×3): 0.5 mg via INTRAVENOUS

## 2013-01-25 MED ORDER — LIDOCAINE HCL (CARDIAC) 20 MG/ML IV SOLN
INTRAVENOUS | Status: DC | PRN
  Administered 2013-01-25: 50 mg via INTRAVENOUS

## 2013-01-25 MED ORDER — HYDROMORPHONE HCL PF 1 MG/ML IJ SOLN
INTRAMUSCULAR | Status: AC
  Filled 2013-01-25: qty 1

## 2013-01-25 MED ORDER — ONDANSETRON HCL 4 MG/2ML IJ SOLN
INTRAMUSCULAR | Status: AC
  Filled 2013-01-25: qty 2

## 2013-01-25 MED ORDER — FENTANYL CITRATE 0.05 MG/ML IJ SOLN
100.0000 ug | Freq: Once | INTRAMUSCULAR | Status: AC
  Administered 2013-01-25: 100 ug via INTRAVENOUS

## 2013-01-25 MED ORDER — ONDANSETRON HCL 4 MG/2ML IJ SOLN
INTRAMUSCULAR | Status: DC | PRN
  Administered 2013-01-25: 4 mg via INTRAVENOUS

## 2013-01-25 MED ORDER — ONDANSETRON HCL 4 MG/2ML IJ SOLN
4.0000 mg | Freq: Once | INTRAMUSCULAR | Status: AC | PRN
  Administered 2013-01-25: 4 mg via INTRAVENOUS

## 2013-01-25 MED ORDER — FENTANYL CITRATE 0.05 MG/ML IJ SOLN
INTRAMUSCULAR | Status: DC | PRN
  Administered 2013-01-25: 50 ug via INTRAVENOUS
  Administered 2013-01-25: 25 ug via INTRAVENOUS
  Administered 2013-01-25: 50 ug via INTRAVENOUS
  Administered 2013-01-25 (×3): 25 ug via INTRAVENOUS
  Administered 2013-01-25: 50 ug via INTRAVENOUS

## 2013-01-25 MED ORDER — DIPHENHYDRAMINE HCL 25 MG PO CAPS: 25.0000 mg | ORAL_CAPSULE | Freq: Four times a day (QID) | ORAL | Status: DC | PRN

## 2013-01-25 MED ORDER — CHLORHEXIDINE GLUCONATE 4 % EX LIQD: 60.0000 mL | Freq: Once | CUTANEOUS | Status: DC

## 2013-01-25 MED ORDER — MIDAZOLAM HCL 5 MG/5ML IJ SOLN
INTRAMUSCULAR | Status: DC | PRN
  Administered 2013-01-25: 2 mg via INTRAVENOUS

## 2013-01-25 MED ORDER — FUROSEMIDE 20 MG PO TABS
20.0000 mg | ORAL_TABLET | ORAL | Status: DC
  Administered 2013-01-26: 20 mg via ORAL
  Filled 2013-01-25: qty 1

## 2013-01-25 MED ORDER — ZOLPIDEM TARTRATE 5 MG PO TABS: 5.0000 mg | ORAL_TABLET | Freq: Every evening | ORAL | Status: DC | PRN

## 2013-01-25 MED ORDER — DOCUSATE SODIUM 100 MG PO CAPS
100.0000 mg | ORAL_CAPSULE | Freq: Two times a day (BID) | ORAL | Status: DC
  Administered 2013-01-25 – 2013-01-26 (×2): 100 mg via ORAL
  Filled 2013-01-25 (×2): qty 1

## 2013-01-25 MED ORDER — APIXABAN 5 MG PO TABS
5.0000 mg | ORAL_TABLET | Freq: Two times a day (BID) | ORAL | Status: DC
  Administered 2013-01-25 – 2013-01-26 (×2): 5 mg via ORAL
  Filled 2013-01-25 (×3): qty 1

## 2013-01-25 MED ORDER — SERTRALINE HCL 50 MG PO TABS
50.0000 mg | ORAL_TABLET | Freq: Every day | ORAL | Status: DC
  Administered 2013-01-26: 50 mg via ORAL
  Filled 2013-01-25: qty 1

## 2013-01-25 MED ORDER — METHOCARBAMOL 100 MG/ML IJ SOLN: 500.0000 mg | Freq: Four times a day (QID) | INTRAVENOUS | Status: DC | PRN

## 2013-01-25 MED ORDER — SIMVASTATIN 5 MG PO TABS
5.0000 mg | ORAL_TABLET | Freq: Every day | ORAL | Status: DC
  Administered 2013-01-25 – 2013-01-26 (×2): 5 mg via ORAL
  Filled 2013-01-25 (×2): qty 1

## 2013-01-25 MED ORDER — ONDANSETRON HCL 4 MG PO TABS: 4.0000 mg | ORAL_TABLET | Freq: Four times a day (QID) | ORAL | Status: DC | PRN

## 2013-01-25 MED ORDER — ARTIFICIAL TEARS OP OINT
TOPICAL_OINTMENT | OPHTHALMIC | Status: DC | PRN
  Administered 2013-01-25: 1 via OPHTHALMIC

## 2013-01-25 MED ORDER — PROPOFOL 10 MG/ML IV BOLUS
INTRAVENOUS | Status: DC | PRN
  Administered 2013-01-25: 200 mg via INTRAVENOUS

## 2013-01-25 MED ORDER — AMIODARONE HCL 100 MG PO TABS
100.0000 mg | ORAL_TABLET | Freq: Every day | ORAL | Status: DC
  Administered 2013-01-26: 100 mg via ORAL
  Filled 2013-01-25: qty 1

## 2013-01-25 MED ORDER — SUMATRIPTAN SUCCINATE 50 MG PO TABS
50.0000 mg | ORAL_TABLET | ORAL | Status: DC | PRN
  Filled 2013-01-25: qty 1

## 2013-01-25 MED ORDER — METHOCARBAMOL 500 MG PO TABS
500.0000 mg | ORAL_TABLET | Freq: Four times a day (QID) | ORAL | Status: DC | PRN
  Administered 2013-01-25 – 2013-01-26 (×2): 500 mg via ORAL
  Filled 2013-01-25 (×3): qty 1

## 2013-01-25 MED ORDER — LACTATED RINGERS IV SOLN: INTRAVENOUS | Status: DC

## 2013-01-25 MED ORDER — FUROSEMIDE 20 MG PO TABS: 20.0000 mg | ORAL_TABLET | ORAL | Status: DC

## 2013-01-25 MED ORDER — OXYCODONE HCL 5 MG PO TABS
5.0000 mg | ORAL_TABLET | ORAL | Status: DC | PRN
  Administered 2013-01-26 (×5): 10 mg via ORAL
  Filled 2013-01-25 (×6): qty 2

## 2013-01-25 MED ORDER — ALPRAZOLAM 0.5 MG PO TABS
0.5000 mg | ORAL_TABLET | Freq: Four times a day (QID) | ORAL | Status: DC | PRN
  Administered 2013-01-26 (×2): 0.5 mg via ORAL
  Filled 2013-01-25 (×2): qty 1

## 2013-01-25 MED ORDER — ENALAPRIL MALEATE 5 MG PO TABS
5.0000 mg | ORAL_TABLET | Freq: Two times a day (BID) | ORAL | Status: DC
  Administered 2013-01-25 – 2013-01-26 (×2): 5 mg via ORAL
  Filled 2013-01-25 (×3): qty 1

## 2013-01-25 MED ORDER — 0.9 % SODIUM CHLORIDE (POUR BTL) OPTIME
TOPICAL | Status: DC | PRN
  Administered 2013-01-25: 1000 mL

## 2013-01-25 MED ORDER — HYDROMORPHONE HCL PF 1 MG/ML IJ SOLN
INTRAMUSCULAR | Status: AC
  Administered 2013-01-25: 0.5 mg via INTRAVENOUS
  Filled 2013-01-25: qty 1

## 2013-01-25 MED ORDER — MUPIROCIN 2 % EX OINT
TOPICAL_OINTMENT | CUTANEOUS | Status: AC
  Administered 2013-01-25: 1 via NASAL
  Filled 2013-01-25: qty 22

## 2013-01-25 MED ORDER — LINAGLIPTIN 5 MG PO TABS
2.5000 mg | ORAL_TABLET | Freq: Two times a day (BID) | ORAL | Status: DC
  Administered 2013-01-26 (×2): 2.5 mg via ORAL
  Filled 2013-01-25 (×4): qty 1

## 2013-01-25 MED ORDER — DIGOXIN 250 MCG PO TABS
0.2500 mg | ORAL_TABLET | Freq: Every day | ORAL | Status: DC
  Administered 2013-01-26: 0.25 mg via ORAL
  Filled 2013-01-25: qty 1

## 2013-01-25 MED ORDER — ONDANSETRON HCL 4 MG/2ML IJ SOLN
4.0000 mg | Freq: Four times a day (QID) | INTRAMUSCULAR | Status: DC | PRN
  Administered 2013-01-25: 4 mg via INTRAVENOUS
  Filled 2013-01-25: qty 2

## 2013-01-25 MED ORDER — CARVEDILOL 25 MG PO TABS
25.0000 mg | ORAL_TABLET | Freq: Two times a day (BID) | ORAL | Status: DC
  Administered 2013-01-25 – 2013-01-26 (×3): 25 mg via ORAL
  Filled 2013-01-25 (×5): qty 1

## 2013-01-25 MED ORDER — FENTANYL CITRATE 0.05 MG/ML IJ SOLN
INTRAMUSCULAR | Status: AC
  Filled 2013-01-25: qty 2

## 2013-01-25 SURGICAL SUPPLY — 68 items
1.6mm K-wire ×2 IMPLANT
BANDAGE ELASTIC 3 VELCRO ST LF (GAUZE/BANDAGES/DRESSINGS) ×2 IMPLANT
BANDAGE ELASTIC 4 VELCRO ST LF (GAUZE/BANDAGES/DRESSINGS) ×2 IMPLANT
BANDAGE GAUZE ELAST BULKY 4 IN (GAUZE/BANDAGES/DRESSINGS) ×2 IMPLANT
BIT DRILL 2 FAST STEP (BIT) ×2 IMPLANT
BLADE SURG ROTATE 9660 (MISCELLANEOUS) IMPLANT
BNDG ESMARK 4X9 LF (GAUZE/BANDAGES/DRESSINGS) ×2 IMPLANT
CLOTH BEACON ORANGE TIMEOUT ST (SAFETY) ×2 IMPLANT
CORDS BIPOLAR (ELECTRODE) ×2 IMPLANT
COVER SURGICAL LIGHT HANDLE (MISCELLANEOUS) ×2 IMPLANT
CUFF TOURNIQUET SINGLE 18IN (TOURNIQUET CUFF) IMPLANT
CUFF TOURNIQUET SINGLE 24IN (TOURNIQUET CUFF) ×2 IMPLANT
DRAIN TLS ROUND 10FR (DRAIN) IMPLANT
DRAPE OEC MINIVIEW 54X84 (DRAPES) ×2 IMPLANT
DRAPE SURG 17X11 SM STRL (DRAPES) ×2 IMPLANT
DRSG ADAPTIC 3X8 NADH LF (GAUZE/BANDAGES/DRESSINGS) ×2 IMPLANT
ELECT REM PT RETURN 9FT ADLT (ELECTROSURGICAL)
ELECTRODE REM PT RTRN 9FT ADLT (ELECTROSURGICAL) IMPLANT
Fast Drill Bit 2.0mm ×2 IMPLANT
GAUZE SPONGE 4X4 16PLY XRAY LF (GAUZE/BANDAGES/DRESSINGS) ×2 IMPLANT
GAUZE XEROFORM 1X8 LF (GAUZE/BANDAGES/DRESSINGS) ×2 IMPLANT
GLOVE BIOGEL PI IND STRL 6.5 (GLOVE) ×4 IMPLANT
GLOVE BIOGEL PI IND STRL 7.0 (GLOVE) ×1 IMPLANT
GLOVE BIOGEL PI IND STRL 8.5 (GLOVE) ×1 IMPLANT
GLOVE BIOGEL PI INDICATOR 6.5 (GLOVE) ×4
GLOVE BIOGEL PI INDICATOR 7.0 (GLOVE) ×1
GLOVE BIOGEL PI INDICATOR 8.5 (GLOVE) ×1
GLOVE ECLIPSE 7.0 STRL STRAW (GLOVE) ×2 IMPLANT
GLOVE SURG ORTHO 8.0 STRL STRW (GLOVE) ×2 IMPLANT
GLOVE SURG SS PI 7.0 STRL IVOR (GLOVE) ×4 IMPLANT
GOWN PREVENTION PLUS XLARGE (GOWN DISPOSABLE) ×6 IMPLANT
GOWN STRL NON-REIN LRG LVL3 (GOWN DISPOSABLE) ×2 IMPLANT
GOWN STRL REIN XL XLG (GOWN DISPOSABLE) ×4 IMPLANT
High Strength Left F3 Plate ×2 IMPLANT
K-WIRE 2X5 SS THRDED S3 (WIRE) ×2
KIT BASIN OR (CUSTOM PROCEDURE TRAY) ×2 IMPLANT
KIT ROOM TURNOVER OR (KITS) ×2 IMPLANT
KWIRE 2X5 SS THRDED S3 (WIRE) ×1 IMPLANT
MANIFOLD NEPTUNE II (INSTRUMENTS) IMPLANT
NEEDLE HYPO 25X1 1.5 SAFETY (NEEDLE) IMPLANT
NS IRRIG 1000ML POUR BTL (IV SOLUTION) ×2 IMPLANT
PACK ORTHO EXTREMITY (CUSTOM PROCEDURE TRAY) ×2 IMPLANT
PAD ARMBOARD 7.5X6 YLW CONV (MISCELLANEOUS) ×4 IMPLANT
PAD CAST 4YDX4 CTTN HI CHSV (CAST SUPPLIES) ×2 IMPLANT
PADDING CAST COTTON 4X4 STRL (CAST SUPPLIES) ×2
SCREW PEG 2.5X14 NONLOCK (Screw) ×6 IMPLANT
SCREW PEG 2.5X16 NONLOCK (Screw) ×4 IMPLANT
SCREW PEG 2.5X20 NONLOCK (Screw) ×2 IMPLANT
SCREW PEG LOCK 2.5X10 (Peg) ×2 IMPLANT
SCREW PEG LOCK 2.5X12 (Screw) ×2 IMPLANT
SCREW PEG LOCK 2.5X18 (Peg) ×2 IMPLANT
SCREW PEG LOCK 2.5X20 (Peg) ×4 IMPLANT
SOAP 2 % CHG 4 OZ (WOUND CARE) ×2 IMPLANT
SPONGE GAUZE 4X4 12PLY (GAUZE/BANDAGES/DRESSINGS) ×2 IMPLANT
STRIP CLOSURE SKIN 1/2X4 (GAUZE/BANDAGES/DRESSINGS) IMPLANT
SUCTION FRAZIER TIP 10 FR DISP (SUCTIONS) ×2 IMPLANT
SUT ETHILON 4 0 PS 2 18 (SUTURE) IMPLANT
SUT MNCRL AB 4-0 PS2 18 (SUTURE) IMPLANT
SUT VIC AB 2-0 FS1 27 (SUTURE) ×2 IMPLANT
SUT VICRYL 4-0 PS2 18IN ABS (SUTURE) ×2 IMPLANT
SYR CONTROL 10ML LL (SYRINGE) IMPLANT
SYSTEM CHEST DRAIN TLS 7FR (DRAIN) IMPLANT
TOWEL OR 17X24 6PK STRL BLUE (TOWEL DISPOSABLE) ×2 IMPLANT
TOWEL OR 17X26 10 PK STRL BLUE (TOWEL DISPOSABLE) ×2 IMPLANT
TUBE CONNECTING 12X1/4 (SUCTIONS) ×2 IMPLANT
WATER STERILE IRR 1000ML POUR (IV SOLUTION) ×2 IMPLANT
YANKAUER SUCT BULB TIP NO VENT (SUCTIONS) ×2 IMPLANT
high Flex Left F3 Plate ×2 IMPLANT

## 2013-01-25 NOTE — Progress Notes (Signed)
Dr. Randa Evens said ok to take pt to room.

## 2013-01-25 NOTE — H&P (Signed)
[name] is an 52 y.o. female.   Chief Complaint: LEFT WRIST INJURY HPI: FELL AT HOME, PT WITH COMMINUTED DISTAL RADIUS FRACTURE PT SEEN/EVALUATED IN OFFICE  HERE FOR SURGERY ON LEFT WRIST  NO PRIOR INJURY TO LEFT WRIST RHD  Past Medical History  Diagnosis Date  . PCOS (polycystic ovarian syndrome)   . Mitral valve prolapse   . Diabetes mellitus     since 2002, NIDDM on OHAS  . Kidney calculi     lithotripsy 20112 times  . CHF (congestive heart failure)   . Hypertension   . Heart murmur     mvp  . Hypothyroidism   . Anxiety   . Depression   . Dysrhythmia     pvc,hx Aflutter  . LBBB (left bundle branch block)     Past Surgical History  Procedure Laterality Date  . Lithotripsy    . Hernia repair      1965  . Cholecystectomy      May 2012  . Dilation and curettage of uterus      1990 with second pregnancy, blighted ovum  . Tonsillectomy      Family History  Problem Relation Age of Onset  . Arthritis Mother     RA  . Diabetes Father     Type II  . Hypertension Father    Social History:  reports that she has never smoked. She does not have any smokeless tobacco history on file. She reports that she does not drink alcohol or use illicit drugs.  Allergies:  Allergies  Allergen Reactions  . Demerol     vomiting  . Hydrocodone-Acetaminophen   . Indomethacin     High BP & CBG  . Lisinopril     LOw BP  . Percocet (Oxycodone-Acetaminophen)     itching  . Sulfa Antibiotics Itching  . Vicodin (Hydrocodone-Acetaminophen)     Medications Prior to Admission  Medication Sig Dispense Refill  . ALPRAZolam (XANAX) 0.25 MG tablet Take 0.25 mg by mouth 3 (three) times daily as needed for anxiety.       Marland Kitchen amiodarone (PACERONE) 100 MG tablet Take 100 mg by mouth daily.      Marland Kitchen apixaban (ELIQUIS) 5 MG TABS tablet Take 1 tablet (5 mg total) by mouth 2 (two) times daily.  10 tablet  0  . carvedilol (COREG) 25 MG tablet Take 1 tablet (25 mg total) by mouth 2 (two) times  daily with a meal. as directed  90 tablet  6  . digoxin (LANOXIN) 0.25 MG tablet Take 1 tablet (0.25 mg total) by mouth daily.  5 tablet  0  . enalapril (VASOTEC) 10 MG tablet Take 0.5 tablets (5 mg total) by mouth 2 (two) times daily.  30 tablet  6  . Etonogestrel-Ethinyl Estradiol (NUVARING VA) Place vaginally every 30 (thirty) days.        . fish oil-omega-3 fatty acids 1000 MG capsule Take 2 g by mouth daily. Krill oil capsule      . furosemide (LASIX) 40 MG tablet Take 20-40 mg by mouth every other day. May take additional tablet daily for weight gain greater than 3 lbs in 24 hours      . ibuprofen (ADVIL,MOTRIN) 800 MG tablet Take 800 mg by mouth every 8 (eight) hours as needed. For pain      . levothyroxine (SYNTHROID, LEVOTHROID) 75 MCG tablet Take 75 mcg by mouth daily.      . Multiple Vitamin (MULTIVITAMIN) tablet Take 1 tablet  by mouth daily.      . potassium chloride SA (K-DUR,KLOR-CON) 20 MEQ tablet Take 20 mEq by mouth daily.      . pravastatin (PRAVACHOL) 10 MG tablet Take 10 mg by mouth daily.      . Saxagliptin-Metformin 2.03-999 MG TB24 Take 1 tablet by mouth 2 (two) times daily with a meal.       . sertraline (ZOLOFT) 50 MG tablet Take 50 mg by mouth daily.      Marland Kitchen spironolactone (ALDACTONE) 25 MG tablet Take 25 mg by mouth daily.      . traMADol (ULTRAM) 50 MG tablet Take 50 mg by mouth every 6 (six) hours as needed for pain.      . rizatriptan (MAXALT) 10 MG tablet Take 10 mg by mouth as needed for migraine. May repeat in 2 hours if needed        Results for orders placed during the hospital encounter of 01/25/13 (from the past 48 hour(s))  GLUCOSE, CAPILLARY     Status: Abnormal   Collection Time    01/25/13  9:21 AM      Result Value Range   Glucose-Capillary 158 (*) 70 - 99 mg/dL  CBC     Status: Abnormal   Collection Time    01/25/13  9:47 AM      Result Value Range   WBC 8.6  4.0 - 10.5 K/uL   RBC 3.98  3.87 - 5.11 MIL/uL   Hemoglobin 11.5 (*) 12.0 - 15.0 g/dL    HCT 16.1 (*) 09.6 - 46.0 %   MCV 85.4  78.0 - 100.0 fL   MCH 28.9  26.0 - 34.0 pg   MCHC 33.8  30.0 - 36.0 g/dL   RDW 04.5  40.9 - 81.1 %   Platelets 288  150 - 400 K/uL  BASIC METABOLIC PANEL     Status: Abnormal   Collection Time    01/25/13  9:47 AM      Result Value Range   Sodium 136  135 - 145 mEq/L   Potassium 3.9  3.5 - 5.1 mEq/L   Chloride 99  96 - 112 mEq/L   CO2 22  19 - 32 mEq/L   Glucose, Bld 171 (*) 70 - 99 mg/dL   BUN 13  6 - 23 mg/dL   Creatinine, Ser 9.14  0.50 - 1.10 mg/dL   Calcium 9.0  8.4 - 78.2 mg/dL   GFR calc non Af Amer >90  >90 mL/min   GFR calc Af Amer >90  >90 mL/min   Comment:            The eGFR has been calculated     using the CKD EPI equation.     This calculation has not been     validated in all clinical     situations.     eGFR's persistently     <90 mL/min signify     possible Chronic Kidney Disease.  APTT     Status: None   Collection Time    01/25/13  9:47 AM      Result Value Range   aPTT 26  24 - 37 seconds  PROTIME-INR     Status: None   Collection Time    01/25/13  9:47 AM      Result Value Range   Prothrombin Time 13.0  11.6 - 15.2 seconds   INR 0.99  0.00 - 1.49   No results found.  NO RECENT ILLNESSES  OR HOSPITALIZATIONS  Blood pressure 168/71, pulse 73, temperature 98.2 F (36.8 C), temperature source Oral, resp. rate 14, height 5\' 8"  (1.727 m), weight 96.163 kg (212 lb), last menstrual period 11/03/2012, SpO2 99.00%. General Appearance:  Alert, cooperative, no distress, appears stated age  Head:  Normocephalic, without obvious abnormality, atraumatic  Eyes:  Pupils equal, conjunctiva/corneas clear,         Throat: Lips, mucosa, and tongue normal; teeth and gums normal  Neck: No visible masses     Lungs:   respirations unlabored  Chest Wall:  No tenderness or deformity  Heart:  Regular rate and rhythm,  Abdomen:   Soft, non-tender,         Extremities: LEFT WRIST: SUGARTONG SPLINT IN PLACE FINGERS WARM  WELL PERFUSED WIGGLES FINGERS  Pulses: 2+ and symmetric  Skin: Skin color, texture, turgor normal, no rashes or lesions     Neurologic: Normal    Assessment/Plan LEFT WRIST COMMINUTED DISTAL RADIUS FRACTURE 4 OR MORE PARTS  LEFT WRIST OPEN REDUCTION AND INTERNAL FIXATION, POSSIBLE PINNING POSSIBLE EXTERNAL FIXATION  R/B/A DISCUSSED WITH PT IN OFFICE.  PT VOICED UNDERSTANDING OF PLAN CONSENT SIGNED DAY OF SURGERY PT SEEN AND EXAMINED PRIOR TO OPERATIVE PROCEDURE/DAY OF SURGERY SITE MARKED. QUESTIONS ANSWERED WILL REMAIN AN OBSERVATION PATIENT FOLLOWING SURGERY  Sharma Covert 01/25/2013, 11:26 AM

## 2013-01-25 NOTE — Anesthesia Procedure Notes (Addendum)
Anesthesia Regional Block:  Axillary brachial plexus block  Pre-Anesthetic Checklist: ,, timeout performed, Correct Patient, Correct Site, Correct Laterality, Correct Procedure, Correct Position, site marked, Risks and benefits discussed,  Surgical consent,  Pre-op evaluation,  At surgeon's request and post-op pain management  Laterality: Left  Prep: Maximum Sterile Barrier Precautions used, chloraprep and alcohol swabs       Needles:  Injection technique: Single-shot      Needle Gauge: 22 and 22 G    Additional Needles:  Procedures: transarterial technique Axillary brachial plexus block Narrative:  Start time: 01/25/2013 10:55 AM End time: 01/25/2013 11:04 AM Injection made incrementally with aspirations every 5 mL.  Performed by: Personally  Anesthesiologist: Maren Beach MD  Additional Notes: Pt accepts procedure and risks. 15cc 0.5% Marcaine w/ epi w/o difficulty or discomfort. GES   Procedure Name: LMA Insertion Date/Time: 01/25/2013 11:44 AM Performed by: Elizbeth Squires R Pre-anesthesia Checklist: Patient identified, Emergency Drugs available, Suction available and Patient being monitored Patient Re-evaluated:Patient Re-evaluated prior to inductionOxygen Delivery Method: Circle system utilized Preoxygenation: Pre-oxygenation with 100% oxygen Intubation Type: IV induction LMA: LMA inserted LMA Size: 4.0 Number of attempts: 1 Tube secured with: Tape Dental Injury: Teeth and Oropharynx as per pre-operative assessment  Comments: LMA taped/secured by Dr. Katrinka Blazing. Taped to pt's chin

## 2013-01-25 NOTE — Brief Op Note (Signed)
01/25/2013  11:29 AM  PATIENT:  [name]  52 y.o. female  PRE-OPERATIVE DIAGNOSIS:  Left Wrist Distal Radius Fracture  POST-OPERATIVE DIAGNOSIS: SAME  PROCEDURE:  Left wrist orif  SURGEON:  Surgeon(s) and Role:    * Sharma Covert, MD - Primary  PHYSICIAN ASSISTANT: none  ASSISTANTS: none   ANESTHESIA:   general  EBL:     BLOOD ADMINISTERED:none  DRAINS: none   LOCAL MEDICATIONS USED:  MARCAINE     SPECIMEN:  No Specimen  DISPOSITION OF SPECIMEN:  N/A  COUNTS:  YES  TOURNIQUET:    DICTATION: .Other Dictation: Dictation Number [id number]  PLAN OF CARE: Admit for overnight observation  PATIENT DISPOSITION:  PACU - hemodynamically stable.   Delay start of Pharmacological VTE agent (>24hrs) due to surgical blood loss or risk of bleeding: not applicable

## 2013-01-25 NOTE — Progress Notes (Signed)
Pt. Refused serum pregnancy test.

## 2013-01-25 NOTE — Transfer of Care (Signed)
Immediate Anesthesia Transfer of Care Note  Patient: [name]  Procedure(s) Performed: Procedure(s): LEFT WRIST OPEN REDUCTION INTERNAL FIXATION WRIST FRACTURE (Left)  Patient Location: PACU  Anesthesia Type:General  Level of Consciousness: awake, alert  and oriented  Airway & Oxygen Therapy: Patient Spontanous Breathing and Patient connected to nasal cannula oxygen  Post-op Assessment: Report given to PACU RN and Post -op Vital signs reviewed and stable  Post vital signs: Reviewed and stable  Complications: No apparent anesthesia complications

## 2013-01-25 NOTE — Anesthesia Preprocedure Evaluation (Signed)
Anesthesia Evaluation  Patient identified by MRN, date of birth, ID band Patient awake    Reviewed: Allergy & Precautions, H&P , NPO status , Patient's Chart, lab work & pertinent test results  Airway Mallampati: II TM Distance: >3 FB Neck ROM: full    Dental   Pulmonary          Cardiovascular hypertension, +CHF + dysrhythmias Atrial Fibrillation Rhythm:regular Rate:Normal     Neuro/Psych Anxiety Depression    GI/Hepatic   Endo/Other  diabetes, Type 2Hypothyroidism   Renal/GU Renal disease     Musculoskeletal   Abdominal   Peds  Hematology   Anesthesia Other Findings   Reproductive/Obstetrics                           Anesthesia Physical Anesthesia Plan  ASA: III  Anesthesia Plan: General   Post-op Pain Management: MAC Combined w/ Regional for Post-op pain   Induction: Intravenous  Airway Management Planned: LMA  Additional Equipment:   Intra-op Plan:   Post-operative Plan: Extubation in OR  Informed Consent: I have reviewed the patients History and Physical, chart, labs and discussed the procedure including the risks, benefits and alternatives for the proposed anesthesia with the patient or authorized representative who has indicated his/her understanding and acceptance.     Plan Discussed with: CRNA, Anesthesiologist and Surgeon  Anesthesia Plan Comments:         Anesthesia Quick Evaluation

## 2013-01-26 LAB — GLUCOSE, CAPILLARY

## 2013-01-26 MED ORDER — VITAMIN C 500 MG PO TABS: 500.0000 mg | ORAL_TABLET | Freq: Every day | ORAL | Status: DC

## 2013-01-26 MED ORDER — DOCUSATE SODIUM 100 MG PO CAPS: 100.0000 mg | ORAL_CAPSULE | Freq: Two times a day (BID) | ORAL | Status: DC

## 2013-01-26 MED ORDER — CYCLOBENZAPRINE HCL 10 MG PO TABS: 10.0000 mg | ORAL_TABLET | Freq: Three times a day (TID) | ORAL | Status: DC | PRN

## 2013-01-26 MED ORDER — ONDANSETRON HCL 4 MG PO TABS: 4.0000 mg | ORAL_TABLET | Freq: Three times a day (TID) | ORAL | Status: DC | PRN

## 2013-01-26 MED ORDER — HYDROMORPHONE HCL 2 MG PO TABS: 2.0000 mg | ORAL_TABLET | ORAL | Status: DC | PRN

## 2013-01-26 NOTE — Anesthesia Postprocedure Evaluation (Signed)
  Anesthesia Post-op Note  Patient: [name]  Procedure(s) Performed: Procedure(s): LEFT WRIST OPEN REDUCTION INTERNAL FIXATION WRIST FRACTURE (Left)  Patient Location: PACU  Anesthesia Type:General  Level of Consciousness: awake  Airway and Oxygen Therapy: Patient Spontanous Breathing  Post-op Pain: mild  Post-op Assessment: Post-op Vital signs reviewed  Post-op Vital Signs: stable  Complications: No apparent anesthesia complications

## 2013-01-26 NOTE — Plan of Care (Signed)
Problem: Phase II Progression Outcomes Goal: Discharge plan established Recommend no follow up OT at this time after acute care d/c, outpatient OT when appropriate per MD

## 2013-01-26 NOTE — Op Note (Signed)
NAMEINESSA, WARDROP NO.:  [id number]  MEDICAL RECORD NO.:  [id number]  LOCATION:  5N12C                        FACILITY:  [facility]  PHYSICIAN:  Madelynn Done, MD  DATE OF BIRTH:  May 04, 1961  DATE OF PROCEDURE:  01/25/2013 DATE OF DISCHARGE:                              OPERATIVE REPORT   POSTOPERATIVE DIAGNOSIS:  Left wrist comminuted intra-articular distal radius fracture 4 more fragments.  POSTOPERATIVE DIAGNOSIS:  Left wrist comminuted intra-articular distal radius fracture 4 more fragments.  PROCEDURE: 1. Left wrist open treatment of distal radius fracture for intra-     articular fracture 4 more parts. 2. Left wrist brachioradialis tendon lengthening and release. 3. Left wrist EPL tendon sheath incision and release. 4. Radiographs 3 views left wrist.  ATTENDING PHYSICIAN:  Madelynn Done, MD, who scrubbed and present for the entire procedure.  ASSISTANT SURGEON:  None.  ANESTHESIA: 1. General via LMA. 2. Axillary block performed by Dr. Katrinka Blazing.  SURGICAL IMPLANTS:  Hand innovations, F3 plates, 2 L-shaped plates with 2.4 mm screws, and two 0.045 K-wires.  This was a combination of nonlocking and locking fixation.  SURGICAL INDICATIONS:  Ms. Quirion is a right-hand dominant female, who sustained a highly comminuted intra-articular distal radius fracture. The patient was seen and evaluated and recommended to undergo the above procedure.  Risks, benefits, and alternatives were discussed in detail with the patient and signed informed consent was obtained.  Risks include, but not limited to bleeding, infection, damage to nearby nerves, arteries, or tendons, loss of motion of wrist and digits, nonunion, malunion, hardware failure, need for further surgical intervention, and early arthritis.  DESCRIPTION OF PROCEDURE:  The patient was properly identified in the preop holding area and a mark with permanent marker made around left wrist indicates  correct operative site.  The patient was then brought back to the operating room and placed supine on the anesthesia room table.  General anesthesia was administered.  The patient had previously undergone axillary block anesthesia.  A well-padded tourniquet was then placed on the left brachium and sealed with 1000 drape.  Left upper extremity was then prepped and draped in normal sterile fashion.  Time- out was called.  Correct site was identified and procedure then begun. Attention was then turned to the left wrist.  However, the limb was then elevated using Esmarch exsanguination tourniquet insufflated.  The patient was placed in 5 pounds fingertrap traction longitudinally. Given the dorsal comminution, a dorsal approach was then made to the wrist.  A longitudinal incision was made directly in line with the long finger metacarpal, the EPL sheath was then opened.  The sheath was incised longitudinally and retracted radially.  Floor of the 4th dorsal compartment was then elevated to expose the fracture site.  The interval between the 2nd and 4th dorsal compartments was explored.  The patient did have a highly comminuted fracture.  This was very difficult fracture to reduce.  Several loose cartilaginous pieces dorsally that required small, had exited.  In order to maintain any type of reduction on the radial column, brachioradialis was then lengthened and released under direct visualization, protecting the first dorsal compartment tendons. Complete  release of the brachioradialis, the radial styloid was brought back out to length.  An open reduction was then performed at the radial column and held temporarily in place with two 0.045 K-wires.  Attention was then turned to fixation the ulnar column.  The patient did have a very central piece that had soft tissue attachments and cartilaginous at the SL interval.  The scapholunate interosseous ligament was able to be visualized and noted to be  in continuity.  The loose debris was then removed from the joint.  Traction was then applied to the joint in order to obtain a near anatomic reduction intra-articularly.  Following this, the ulnar column was then elevated and then the ulnar column was then held temporarily in place with K-wires keeping enough to length. Following this, ulnar column fixation was then carried out with the using the L-plate.  This is the F3 L-plate and this was appropriately placed with the combination of locking and nonlocking screws with good buttress fixation and distal support along the ulnar margin.  Again, the locking screws distally, combination of locking and nonlocking screws proximally.  After reduction of these 2 intra-articular fragments, the central fragment had been reduced with a buttress and held in place using the L-plate as buttress fixation.  Attention was then turned to the radial column, another L-shaped plate was then cut and then the radial column plate was then applied after it was contoured with a combination locking and nonlocking screws.  The distal screw was not placed to avoid penetration of the K-wires.  The wound was then copiously irrigated.  Copious wound irrigation were done throughout. Following this, the K-wire was then cut and bent, left that in the skin. The retinaculum, final radiographs were then obtained.  Stress radiography did show a good alignment of the radial column and ulnar column with a highly comminuted fracture.  The retinaculum was then closed with 2-0 Vicryl suture.  Subcutaneous tissues were closed with 4-0 Vicryl and skin closed with 4-0 Prolene horizontal mattress.  Adaptic dressing and sterile compressive bandage were then applied.  Xeroform applied around the pin sites.  Sterile compressive bandage with long-arm sugar-tong splint, extubated, and taken to recovery room in good condition.  POSTOPERATIVE PLAN:  The patient was admitted for IV  antibiotics, pain control, discharged in the morning, seen back in the office in approximately 10-14 days for wound check, suture removal, application of a long-arm cast for a total of 3 weeks, short-arm cast for total of 2 weeks, pins out at the 5 week mark and then hopefully begin a therapy regimen.  Guarded prognosis given the highly comminuted intra-articular fracture and I think with restoration of the overall joint alignment, I think she should have a very functional wrist, but with some limitation and likely early arthrosis.     Madelynn Done, MD     FWO/MEDQ  D:  01/25/2013  T:  01/26/2013  Job:  [id number]

## 2013-01-26 NOTE — Evaluation (Signed)
Occupational Therapy Evaluation Patient Details Name: [name] MRN: [id number] DOB: [birth year] Today's Date: 01/26/2013 Time: 4098-1191 OT Time Calculation (min): 25 min  OT Assessment / Plan / Recommendation Clinical Impression  Pt referred to OT services following L wrist fx with ORIF. Pt requires min A for ADLs and for opening food containers and cutting foods due to L UE limitations/restrictions. Pt would benefit from OT services to address these impairments to maximize level of function and safety to return home. Recommend outpatient OT when appropriate per MD    OT Assessment  Patient needs continued OT Services    Follow Up Recommendations  Outpatient OT (when appropriate per MD)    Barriers to Discharge None    Equipment Recommendations   reacher   Recommendations for Other Services    Frequency  Min 2X/week    Precautions / Restrictions Precautions Precautions: Other (comment) (L wrist) Precaution Comments: NWB L wrist, AROM of digits only Required Braces or Orthoses: Other Brace/Splint Other Brace/Splint: L UE cast Restrictions Weight Bearing Restrictions: Yes LUE Weight Bearing: Non weight bearing Other Position/Activity Restrictions: Keep L UE elevated above heart Pt able to verbalize precautions for NWB, digit ROM and elevated position of L UE for edema mgt       ADL  Eating/Feeding: Performed;Set up;Minimal assistance Where Assessed - Eating/Feeding: Edge of bed Grooming: Performed;Wash/dry hands;Wash/dry face;Supervision/safety;Set up Where Assessed - Grooming: Unsupported standing Upper Body Bathing: Simulated;Minimal assistance Where Assessed - Upper Body Bathing: Unsupported sitting;Unsupported standing Lower Body Bathing: Simulated;Minimal assistance Where Assessed - Lower Body Bathing: Unsupported sitting;Unsupported standing Upper Body Dressing: Performed;Minimal assistance Where Assessed - Upper Body Dressing: Unsupported sitting Lower Body  Dressing: Performed;Minimal assistance Where Assessed - Lower Body Dressing: Unsupported sitting Toilet Transfer: Performed;Supervision/safety Toilet Transfer Method: Sit to stand;Other (comment) (ambulating with no AD) Toilet Transfer Equipment: Regular height toilet Toileting - Clothing Manipulation and Hygiene: Performed;Min guard Where Assessed - Toileting Clothing Manipulation and Hygiene: Standing ADL Comments: Pt provided with education and demo of ADL A/E for use at home. Pt states that she is able to get into shower at home and keeps L arm outside of shower to keep from getting wet    OT Diagnosis: Acute pain;Generalized weakness  OT Problem List: Decreased knowledge of use of DME or AE;Decreased range of motion;Impaired UE functional use;Pain OT Treatment Interventions: Self-care/ADL training;Therapeutic exercise;Patient/family education   OT Goals Acute Rehab OT Goals OT Goal Formulation: With patient Time For Goal Achievement: 02/02/13 Potential to Achieve Goals: Good ADL Goals Pt Will Perform Upper Body Bathing: with modified independence;with set-up ADL Goal: Upper Body Bathing - Progress: Goal set today Pt Will Perform Lower Body Bathing: with modified independence;with set-up ADL Goal: Lower Body Bathing - Progress: Goal set today Pt Will Perform Upper Body Dressing: with modified independence;with set-up ADL Goal: Upper Body Dressing - Progress: Goal set today Pt Will Perform Lower Body Dressing: with modified independence;with set-up ADL Goal: Lower Body Dressing - Progress: Goal set today Pt Will Perform Toileting - Clothing Manipulation: with modified independence ADL Goal: Toileting - Clothing Manipulation - Progress: Goal set today  Visit Information  Last OT Received On: 01/26/13    Subjective Data  Subjective: " I hope to go home today" Patient Stated Goal: To return home   Prior Functioning     Home Living Lives With: Alone Available Help at  Discharge: Friend(s) Type of Home: House Home Access: Stairs to enter Secretary/administrator of Steps: 5 Entrance Stairs-Rails: None Home Layout:  Two level;1/2 bath on main level Bathroom Shower/Tub: Engineer, manufacturing systems: Standard Home Adaptive Equipment: None Prior Function Level of Independence: Independent Able to Take Stairs?: Yes Driving: Yes Vocation: Full time employment Comments: Charity fundraiser at [employer] Communication Communication: No difficulties Dominant Hand: Right         Vision/Perception Vision - History Baseline Vision: Wears glasses only for reading Patient Visual Report: No change from baseline Perception Perception: Within Functional Limits   Cognition  Cognition Overall Cognitive Status: Appears within functional limits for tasks assessed/performed Arousal/Alertness: Awake/alert Orientation Level: Oriented X4 / Intact Behavior During Session: [facility] for tasks performed    Extremity/Trunk Assessment Right Upper Extremity Assessment RUE ROM/Strength/Tone: [facility] for tasks assessed Left Upper Extremity Assessment LUE ROM/Strength/Tone: Deficits;Due to pain;Due to precautions LUE ROM/Strength/Tone Deficits: NWB L UE, AROM of digits, elbow and shoulder ROM WFL     Mobility Bed Mobility Bed Mobility: Supine to Sit;Sitting - Scoot to Edge of Bed;Sit to Supine Supine to Sit: 6: Modified independent (Device/Increase time) Sitting - Scoot to Edge of Bed: 6: Modified independent (Device/Increase time) Transfers Transfers: Sit to Stand;Stand to Sit Sit to Stand: 6: Modified independent (Device/Increase time) Stand to Sit: 6: Modified independent (Device/Increase time)     Exercise General Exercises - Upper Extremity Digit Composite Flexion: AROM;Left;10 reps Composite Extension: AROM;Left;10 reps   Balance Balance Balance Assessed: No   End of Session OT - End of Session Activity Tolerance: Patient tolerated treatment well Patient left: in  bed;with call bell/phone within reach  GO Functional Limitation: Self care Self Care Current Status (H0865): At least 1 percent but less than 20 percent impaired, limited or restricted Self Care Goal Status (H8469): At least 1 percent but less than 20 percent impaired, limited or restricted   Galen Manila 01/26/2013, 11:42 AM

## 2013-01-26 NOTE — Patient Instructions (Signed)
Pt to keep L UE elevated with 3 pillows above heart while in bed and in recliner for edema mgt

## 2013-01-26 NOTE — Discharge Summary (Signed)
Physician Discharge Summary  Patient ID: RAVAN SCHLEMMER MRN: [id number] DOB/AGE: March 26, 1961 52 y.o.  Admit date: 01/25/2013 Discharge date: 01/26/2013  Admission Diagnoses: Left Wrist Distal Radius Fracture Past Medical History  Diagnosis Date  . PCOS (polycystic ovarian syndrome)   . Mitral valve prolapse   . Diabetes mellitus     since 2002, NIDDM on OHAS  . Kidney calculi     lithotripsy 20112 times  . CHF (congestive heart failure)   . Hypertension   . Heart murmur     mvp  . Hypothyroidism   . Anxiety   . Depression   . Dysrhythmia     pvc,hx Aflutter  . LBBB (left bundle branch block)     Discharge Diagnoses:  Active Problems:   * No active hospital problems. *   Surgeries: Procedure(s): LEFT WRIST OPEN REDUCTION INTERNAL FIXATION WRIST FRACTURE on 01/25/2013    Consultants:  none  Discharged Condition: Improved  Hospital Course: BEATRIS BELEN is an 52 y.o. female who was admitted 01/25/2013 with a chief complaint of No chief complaint on file. , and found to have a diagnosis of Left Wrist Distal Radius Fracture.  They were brought to the operating room on 01/25/2013 and underwent Procedure(s): LEFT WRIST OPEN REDUCTION INTERNAL FIXATION WRIST FRACTURE.    They were given perioperative antibiotics: Anti-infectives   Start     Dose/Rate Route Frequency Ordered Stop   01/26/13 0200  ceFAZolin (ANCEF) IVPB 1 g/50 mL premix     1 g 100 mL/hr over 30 Minutes Intravenous 3 times per day 01/25/13 1740     01/25/13 1800  ceFAZolin (ANCEF) IVPB 1 g/50 mL premix     1 g 100 mL/hr over 30 Minutes Intravenous NOW 01/25/13 1740 01/25/13 1901   01/25/13 1130  ceFAZolin (ANCEF) 2-3 GM-% IVPB SOLR    Comments:  CHASE, TONJA: cabinet override      01/25/13 1130 01/25/13 1201   01/25/13 0600  ceFAZolin (ANCEF) IVPB 2 g/50 mL premix  Status:  Discontinued     2 g 100 mL/hr over 30 Minutes Intravenous On call to O.R. 01/24/13 2227 01/25/13 4098    .  They were given  sequential compression devices, early ambulation, and Other (comment) ambulation for DVT prophylaxis.  Recent vital signs: Patient Vitals for the past 24 hrs:  BP Temp Temp src Pulse Resp SpO2  01/26/13 1712 90/60 mmHg - - 80 - -  01/26/13 1430 108/45 mmHg 97.9 F (36.6 C) - 84 17 99 %  01/26/13 1047 148/100 mmHg - - 90 - -  01/26/13 0652 119/40 mmHg 97.3 F (36.3 C) Oral 89 18 98 %  01/25/13 2039 145/56 mmHg 98.7 F (37.1 C) Oral 85 18 98 %  .  Recent laboratory studies: No results found.  Discharge Medications:     Medication List    ASK your doctor about these medications       ALPRAZolam 0.25 MG tablet  Commonly known as:  XANAX  Take 0.25 mg by mouth 3 (three) times daily as needed for anxiety.     amiodarone 100 MG tablet  Commonly known as:  PACERONE  Take 100 mg by mouth daily.     apixaban 5 MG Tabs tablet  Commonly known as:  ELIQUIS  Take 1 tablet (5 mg total) by mouth 2 (two) times daily.     carvedilol 25 MG tablet  Commonly known as:  COREG  Take 1 tablet (25 mg total) by mouth 2 (  two) times daily with a meal. as directed     digoxin 0.25 MG tablet  Commonly known as:  LANOXIN  Take 1 tablet (0.25 mg total) by mouth daily.     enalapril 10 MG tablet  Commonly known as:  VASOTEC  Take 0.5 tablets (5 mg total) by mouth 2 (two) times daily.     fish oil-omega-3 fatty acids 1000 MG capsule  Take 2 g by mouth daily. Krill oil capsule     furosemide 20 MG tablet  Commonly known as:  LASIX  Take 20 mg by mouth every other day.     ibuprofen 800 MG tablet  Commonly known as:  ADVIL,MOTRIN  Take 800 mg by mouth every 8 (eight) hours as needed. For pain     levothyroxine 75 MCG tablet  Commonly known as:  SYNTHROID, LEVOTHROID  Take 75 mcg by mouth daily.     multivitamin tablet  Take 1 tablet by mouth daily.     NUVARING VA  Place vaginally every 30 (thirty) days.     potassium chloride SA 20 MEQ tablet  Commonly known as:  K-DUR,KLOR-CON   Take 20 mEq by mouth daily.     pravastatin 10 MG tablet  Commonly known as:  PRAVACHOL  Take 10 mg by mouth daily.     rizatriptan 10 MG tablet  Commonly known as:  MAXALT  Take 10 mg by mouth as needed for migraine. May repeat in 2 hours if needed     Saxagliptin-Metformin 2.03-999 MG Tb24  Take 1 tablet by mouth 2 (two) times daily with a meal.     sertraline 50 MG tablet  Commonly known as:  ZOLOFT  Take 50 mg by mouth daily.     spironolactone 25 MG tablet  Commonly known as:  ALDACTONE  Take 25 mg by mouth daily.     traMADol 50 MG tablet  Commonly known as:  ULTRAM  Take 50 mg by mouth every 6 (six) hours as needed for pain.        Diagnostic Studies: No results found.  They benefited maximally from their hospital stay and there were no complications.     Disposition: 01-Home or Self Care      Future Appointments Provider Department Dept Phone   01/30/2013 8:45 AM [facility] [phone]      Pt seen/examined on day of discharge Ready to go home  Signed: Sharma Covert 01/26/2013, 6:36 PM

## 2013-01-29 ENCOUNTER — Encounter (HOSPITAL_COMMUNITY): Payer: Self-pay | Admitting: Orthopedic Surgery

## 2013-01-30 ENCOUNTER — Ambulatory Visit (HOSPITAL_COMMUNITY): Payer: 59

## 2013-02-15 ENCOUNTER — Other Ambulatory Visit (HOSPITAL_COMMUNITY): Payer: Self-pay | Admitting: Internal Medicine

## 2013-02-15 MED ORDER — APIXABAN 5 MG PO TABS: 5.0000 mg | ORAL_TABLET | Freq: Two times a day (BID) | ORAL | Status: DC

## 2013-02-15 MED ORDER — DIGOXIN 250 MCG PO TABS: 0.2500 mg | ORAL_TABLET | Freq: Every day | ORAL | Status: DC

## 2013-02-15 NOTE — Telephone Encounter (Signed)
Refills sent to pharmacy. 

## 2013-02-24 ENCOUNTER — Ambulatory Visit (HOSPITAL_COMMUNITY)
Admission: RE | Admit: 2013-02-24 | Discharge: 2013-02-24 | Disposition: A | Discharge status: Home / Self Care | Payer: 59 | Source: Ambulatory Visit | Attending: Internal Medicine | Admitting: Internal Medicine

## 2013-02-24 VITALS — BP 134/76 | HR 74 | Wt 219.5 lb

## 2013-02-24 DIAGNOSIS — I5020 Unspecified systolic (congestive) heart failure: Secondary | ICD-10-CM | POA: Insufficient documentation

## 2013-02-24 DIAGNOSIS — I509 Heart failure, unspecified: Secondary | ICD-10-CM | POA: Insufficient documentation

## 2013-02-24 DIAGNOSIS — I5022 Chronic systolic (congestive) heart failure: Secondary | ICD-10-CM

## 2013-02-24 DIAGNOSIS — E119 Type 2 diabetes mellitus without complications: Secondary | ICD-10-CM | POA: Insufficient documentation

## 2013-02-24 DIAGNOSIS — E282 Polycystic ovarian syndrome: Secondary | ICD-10-CM | POA: Insufficient documentation

## 2013-02-24 DIAGNOSIS — I059 Rheumatic mitral valve disease, unspecified: Secondary | ICD-10-CM | POA: Insufficient documentation

## 2013-02-24 DIAGNOSIS — F329 Major depressive disorder, single episode, unspecified: Secondary | ICD-10-CM | POA: Insufficient documentation

## 2013-02-24 DIAGNOSIS — E669 Obesity, unspecified: Secondary | ICD-10-CM | POA: Insufficient documentation

## 2013-02-24 DIAGNOSIS — F411 Generalized anxiety disorder: Secondary | ICD-10-CM | POA: Insufficient documentation

## 2013-02-24 DIAGNOSIS — F3289 Other specified depressive episodes: Secondary | ICD-10-CM | POA: Insufficient documentation

## 2013-02-24 DIAGNOSIS — I10 Essential (primary) hypertension: Secondary | ICD-10-CM | POA: Insufficient documentation

## 2013-02-24 MED ORDER — ENALAPRIL MALEATE 10 MG PO TABS: ORAL_TABLET | ORAL | Status: DC

## 2013-02-24 NOTE — Assessment & Plan Note (Signed)
Volume status well controlled on lasix every other day.  Will titrate enalapril to 5mg  in the morning and 10 mg at night.  Thre was some discrepancy with the EF read by Dr. Gala Romney and the final report.  Will repeat echo and follow up in 3 months.  Reinforced daily weights and sodium restrictions.

## 2013-02-24 NOTE — Patient Instructions (Addendum)
Increase enalapril 1 tab at night and continue 0.5 tab in the morning  Follow up 2-3 months with echo

## 2013-02-24 NOTE — Progress Notes (Signed)
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 She has been under increased stress recently with her husband dying in the cath lab during an MI and her son getting married as well as studying for NP school.   She was admitted to [facility] in 01/2012 for acute systolic heart failure, presumed NICM, EF 25% (no cath or stress testing performed).  ProBNP 726.  Started on ACE-I, carvedilol, digoxin, and spironolactone. There was mild MR, mildly dilated LA.  TSH elevated but Free T3 and T4 within normal limits, ANA/HIV/Hepatitis panels normal.   Event monitor: 03/25/12 Frequent PVCs   A-Flutter  Last visit brief runs AFL and PVCs bigeminy. Dr Gala Romney discussed with Dr Graciela Husbands and she started Amiodarone 200 mg bid.  Echos: 04/08/12 EF 30-35% LVIDd 5.6 (previous LVIDd 6.2 EF 25%) 05/23/12 EF 30-35%, grade 1 diastolic dysfunction.   08/08/12 EF 50% with septal/inferior hypokinesis.  Grade 2 diastolic dysfunction.  Mild MR.   She returns for follow up today.  She is doing well.  She fell on the ice in Feb and is s/p ORIF to Lt wrist.  She denies dyspnea, orthopnea or PND.  She c/o of fatigue but it has improved some, she feels it is related to working and trying to finish clinicals.  She will finish NP school in a couple of weeks!!  Currently trying to look for jobs.  She denies lower extremity edema.      ROS: All systems negative except as listed in HPI, PMH and Problem List.  Past Medical History  Diagnosis Date  . PCOS (polycystic ovarian syndrome)   . Mitral valve prolapse   . Diabetes mellitus     since 2002, NIDDM on OHAS  . Kidney calculi     lithotripsy 20112 times  . CHF (congestive heart failure)   . Hypertension   . Heart murmur     mvp  . Hypothyroidism   . Anxiety   . Depression   . Dysrhythmia     pvc,hx Aflutter  . LBBB (left  bundle branch block)     Current Outpatient Prescriptions  Medication Sig Dispense Refill  . ALPRAZolam (XANAX) 0.25 MG tablet Take 0.25 mg by mouth 3 (three) times daily as needed for anxiety.       Marland Kitchen amiodarone (PACERONE) 100 MG tablet Take 100 mg by mouth daily.      Marland Kitchen apixaban (ELIQUIS) 5 MG TABS tablet Take 1 tablet (5 mg total) by mouth 2 (two) times daily.  60 tablet  6  . carvedilol (COREG) 25 MG tablet Take 1 tablet (25 mg total) by mouth 2 (two) times daily with a meal. as directed  90 tablet  6  . cyclobenzaprine (FLEXERIL) 10 MG tablet Take 1 tablet (10 mg total) by mouth 3 (three) times daily as needed for muscle spasms.  30 tablet  0  . digoxin (LANOXIN) 0.25 MG tablet Take 1 tablet (0.25 mg total) by mouth daily.  30 tablet  6  . docusate sodium (COLACE) 100 MG capsule Take 1 capsule (100 mg total) by mouth 2 (two) times daily.  30 capsule  0  . enalapril (VASOTEC) 10 MG tablet Take 0.5 tablets (5 mg total) by mouth 2 (two) times daily.  30 tablet  6  . Etonogestrel-Ethinyl Estradiol (NUVARING VA) Place vaginally every 30 (thirty)  days.        . fish oil-omega-3 fatty acids 1000 MG capsule Take 2 g by mouth daily. Krill oil capsule      . furosemide (LASIX) 20 MG tablet Take 20 mg by mouth every other day.      Marland Kitchen HYDROmorphone (DILAUDID) 2 MG tablet Take 1 tablet (2 mg total) by mouth every 4 (four) hours as needed for pain (one to two tabs every four to six hours for pain).  50 tablet  0  . levothyroxine (SYNTHROID, LEVOTHROID) 75 MCG tablet Take 75 mcg by mouth daily.      . Multiple Vitamin (MULTIVITAMIN) tablet Take 1 tablet by mouth daily.      . ondansetron (ZOFRAN) 4 MG tablet Take 1 tablet (4 mg total) by mouth every 8 (eight) hours as needed for nausea.  20 tablet  0  . potassium chloride SA (K-DUR,KLOR-CON) 20 MEQ tablet Take 20 mEq by mouth daily.      . pravastatin (PRAVACHOL) 10 MG tablet Take 10 mg by mouth daily.      . rizatriptan (MAXALT) 10 MG tablet Take 10 mg  by mouth as needed for migraine. May repeat in 2 hours if needed      . Saxagliptin-Metformin 2.03-999 MG TB24 Take 1 tablet by mouth 2 (two) times daily with a meal.       . sertraline (ZOLOFT) 50 MG tablet Take 50 mg by mouth daily.      Marland Kitchen spironolactone (ALDACTONE) 25 MG tablet Take 25 mg by mouth daily.      . vitamin C (ASCORBIC ACID) 500 MG tablet Take 1 tablet (500 mg total) by mouth daily.  90 tablet  0  . [DISCONTINUED] metFORMIN (GLUMETZA) 1000 MG (MOD) 24 hr tablet Take 1,000 mg by mouth daily with breakfast.         No current facility-administered medications for this encounter.     PHYSICAL EXAM:  Filed Vitals:   02/24/13 0849  BP: 134/76  Pulse: 74  Weight: 219 lb 8 oz (99.565 kg)  SpO2: 97%                                                                    General: Well appearing, NAD HEENT: normal Neck: supple. JVP 6-7. Carotids 2+ bilaterally; no bruits. No lymphadenopathy or thryomegaly appreciated. Cor: PMI normal. Regular rate & rhythm. No rubs, gallops. 2/6 TR Lungs: rhonchi upper lobe Abdomen: soft, nontender, nondistended. No hepatosplenomegaly. No bruits or masses. Good bowel sounds. Extremities: no cyanosis, clubbing, rash, edema.  Lt wrist cast Neuro: alert & orientedx3, cranial nerves grossly intact. Moves all 4 extremities w/o difficulty. Affect pleasant.    ASSESSMENT & PLAN:

## 2013-02-24 NOTE — Assessment & Plan Note (Signed)
She asked if it was ok to start going to the gym to begin to work out.  She will start slow and rest when needed.

## 2013-04-10 IMAGING — CR DG CHEST 2V
2 series · 2 of 2 positions shown · non-contrast
Comparison: 02/20/2012

CLINICAL DATA: Cough for 2 weeks with crackles and wheezing.
History of congestive heart failure.  Nonsmoker

CHEST - 2 VIEW

[w chest pa]
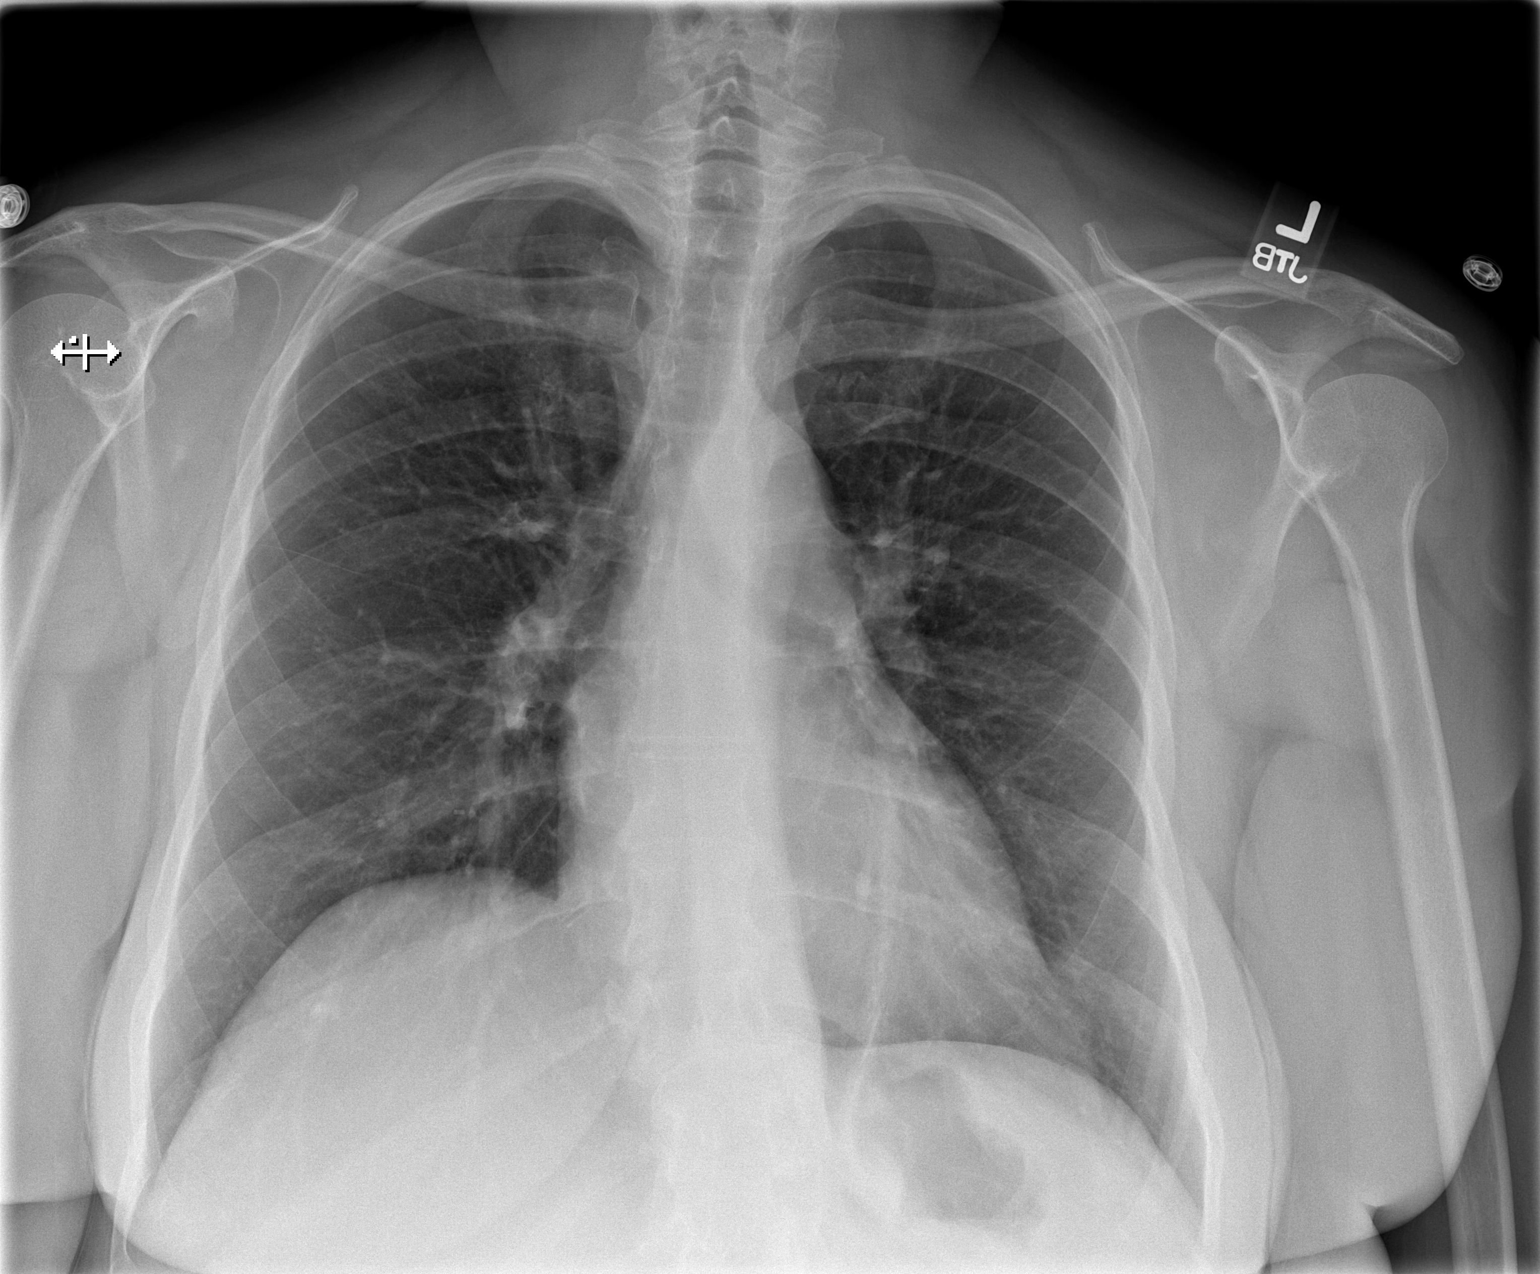

[w chest lat]
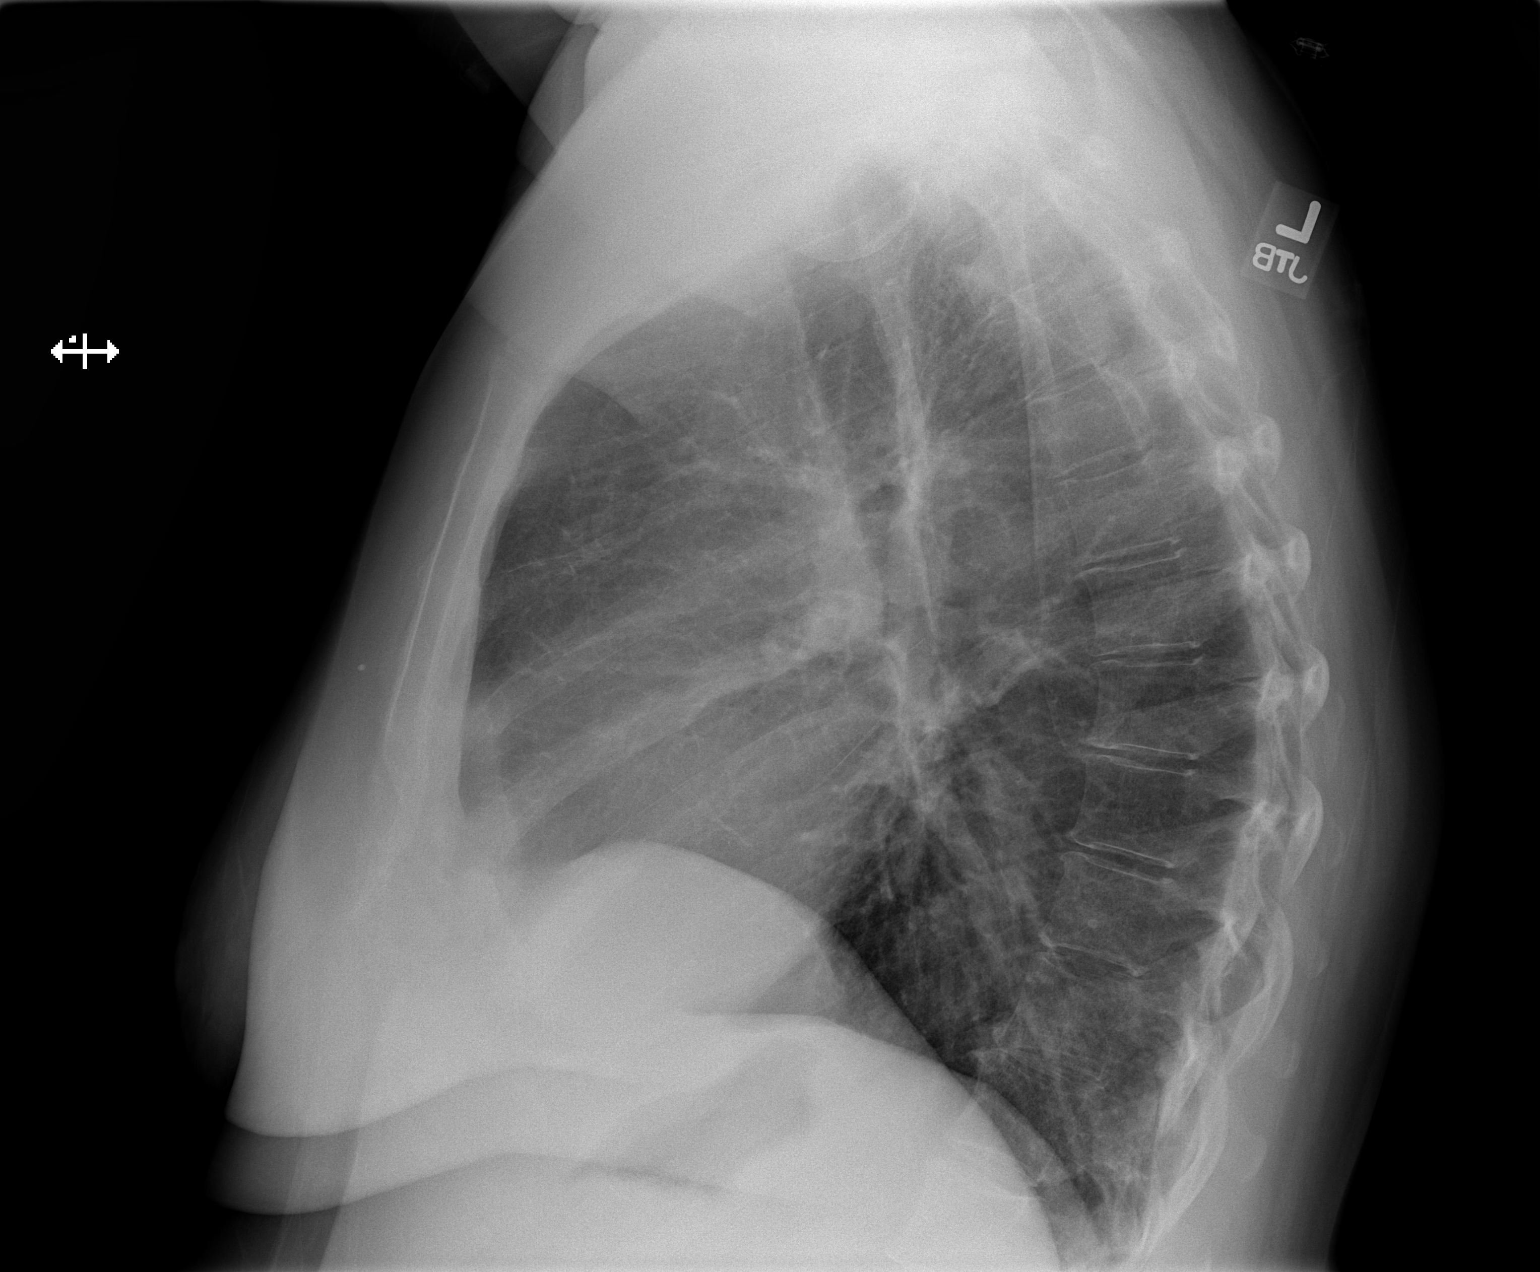

[2 of 2 positions shown; findings below may reference images not displayed]

FINDINGS: Heart size now appears within normal limits.  The lung
fields appear clear with no signs of focal infiltrate or congestive
failure.  No pleural fluid or significant peribronchial cuffing is
identified.

Bony structures appear intact.
IMPRESSION: Normalization of heart size since the previous exam.  Clear lungs

## 2013-09-05 ENCOUNTER — Encounter (HOSPITAL_COMMUNITY): Payer: Self-pay

## 2013-09-05 ENCOUNTER — Ambulatory Visit (HOSPITAL_COMMUNITY)
Admission: RE | Admit: 2013-09-05 | Discharge: 2013-09-05 | Disposition: A | Discharge status: Home / Self Care | Payer: 59 | Source: Ambulatory Visit | Attending: Internal Medicine | Admitting: Internal Medicine

## 2013-09-05 ENCOUNTER — Ambulatory Visit (HOSPITAL_BASED_OUTPATIENT_CLINIC_OR_DEPARTMENT_OTHER)
Admission: RE | Admit: 2013-09-05 | Discharge: 2013-09-05 | Disposition: A | Discharge status: Home / Self Care | Payer: 59 | Source: Ambulatory Visit | Attending: Cardiology | Admitting: Cardiology

## 2013-09-05 VITALS — BP 116/68 | HR 65 | Ht 68.0 in | Wt 207.8 lb

## 2013-09-05 DIAGNOSIS — E669 Obesity, unspecified: Secondary | ICD-10-CM | POA: Insufficient documentation

## 2013-09-05 DIAGNOSIS — R9431 Abnormal electrocardiogram [ECG] [EKG]: Secondary | ICD-10-CM | POA: Insufficient documentation

## 2013-09-05 DIAGNOSIS — I4892 Unspecified atrial flutter: Secondary | ICD-10-CM

## 2013-09-05 DIAGNOSIS — I517 Cardiomegaly: Secondary | ICD-10-CM

## 2013-09-05 DIAGNOSIS — I10 Essential (primary) hypertension: Secondary | ICD-10-CM | POA: Insufficient documentation

## 2013-09-05 DIAGNOSIS — I5022 Chronic systolic (congestive) heart failure: Secondary | ICD-10-CM

## 2013-09-05 DIAGNOSIS — E039 Hypothyroidism, unspecified: Secondary | ICD-10-CM | POA: Insufficient documentation

## 2013-09-05 DIAGNOSIS — I059 Rheumatic mitral valve disease, unspecified: Secondary | ICD-10-CM | POA: Insufficient documentation

## 2013-09-05 DIAGNOSIS — E119 Type 2 diabetes mellitus without complications: Secondary | ICD-10-CM | POA: Insufficient documentation

## 2013-09-05 DIAGNOSIS — E282 Polycystic ovarian syndrome: Secondary | ICD-10-CM | POA: Insufficient documentation

## 2013-09-05 MED ORDER — ENALAPRIL MALEATE 10 MG PO TABS: 10.0000 mg | ORAL_TABLET | Freq: Every day | ORAL | Status: DC

## 2013-09-05 NOTE — Progress Notes (Signed)
Patient ID: [name], female   DOB: 06-21-1961, 52 y.o.   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 She has been under increased stress recently with her husband dying in the cath lab during an MI and her son getting married as well as studying for NP school.   She was admitted to [facility] in 01/2012 for acute systolic heart failure, presumed NICM, EF 25% (no cath or stress testing performed).  ProBNP 726.  Started on ACE-I, carvedilol, digoxin, and spironolactone. There was mild MR, mildly dilated LA.  TSH elevated but Free T3 and T4 within normal limits, ANA/HIV/Hepatitis panels normal.   Event monitor: 03/25/12 Frequent PVCs, A-Flutter.  [name] discussed with [name] and she started Amiodarone 200 mg bid.  Echos: 04/08/12 EF 30-35% LVIDd 5.6 (previous LVIDd 6.2 EF 25%) 05/23/12 EF 30-35%, grade 1 diastolic dysfunction.   08/08/12 EF 50% with septal/inferior hypokinesis.  Grade 2 diastolic dysfunction.  Mild MR.  09/05/13 EF 35-40% with diffuse hypokinesis with septal bounce, mild LV dilation, RV normal  Follow up: Doing well passed her NP boards in August. Feeling great. Denies any SOB, orthopnea or edema. Denies CP. Saw PCP yesterday and A1c elevated. No palpitations or lightheadedness. Walks for exercise.   Labs (9/14): K+ 4.4, creatinine 0.57, ALT 25, AST 22, TSH 1.39, total T4 6.4, A1C 7.8  ECG: NSR, incomplete LBBB with QRS 116 msec, inferior and anterolateral T wave inversions  SH: Nurse practitioner now, works at [employer] currently, nonsmoker, rare ETOH.    FH: Brother with MI at age 79  ROS: All systems negative except as listed in HPI, PMH and Problem List.  Past Medical History  Diagnosis Date  . PCOS (polycystic ovarian syndrome)   . Mitral valve prolapse   . Diabetes mellitus    since 2002, NIDDM on OHAS  . Kidney calculi     lithotripsy 20112 times  . CHF (congestive heart failure)   . Hypertension   . Heart murmur     mvp  . Hypothyroidism   . Anxiety   . Depression   . Dysrhythmia     pvc,hx Aflutter  . LBBB (left bundle branch block)     Current Outpatient Prescriptions  Medication Sig Dispense Refill  . ALPRAZolam (XANAX) 0.25 MG tablet Take 0.25 mg by mouth 3 (three) times daily as needed for anxiety.       Marland Kitchen amiodarone (PACERONE) 100 MG tablet Take 100 mg by mouth daily.      Marland Kitchen apixaban (ELIQUIS) 5 MG TABS tablet Take 1 tablet (5 mg total) by mouth 2 (two) times daily.  60 tablet  6  . carvedilol (COREG) 25 MG tablet Take 1 tablet (25 mg total) by mouth 2 (two) times daily with a meal. as directed  90 tablet  6  . digoxin (LANOXIN) 0.25 MG tablet Take 1 tablet (0.25 mg total) by mouth daily.  30 tablet  6  . enalapril (VASOTEC) 10 MG tablet 0.5 tab in the morning and 1 tab in the afternoon  45 tablet  6  . furosemide (LASIX) 20 MG tablet Take 20 mg by mouth every other day.      . levothyroxine (SYNTHROID, LEVOTHROID) 75 MCG tablet Take 75 mcg by mouth daily.      Marland Kitchen  Multiple Vitamin (MULTIVITAMIN) tablet Take 1 tablet by mouth daily.      . ondansetron (ZOFRAN) 4 MG tablet Take 1 tablet (4 mg total) by mouth every 8 (eight) hours as needed for nausea.  20 tablet  0  . potassium chloride SA (K-DUR,KLOR-CON) 20 MEQ tablet Take 20 mEq by mouth daily.      . pravastatin (PRAVACHOL) 10 MG tablet Take 10 mg by mouth daily.      . rizatriptan (MAXALT) 10 MG tablet Take 10 mg by mouth as needed for migraine. May repeat in 2 hours if needed      . Saxagliptin-Metformin 2.03-999 MG TB24 Take 1 tablet by mouth 2 (two) times daily with a meal.       . sertraline (ZOLOFT) 50 MG tablet Take 50 mg by mouth 2 (two) times daily.       Marland Kitchen spironolactone (ALDACTONE) 25 MG tablet Take 25 mg by mouth daily.      . vitamin C (ASCORBIC ACID) 500 MG tablet Take 1 tablet (500  mg total) by mouth daily.  90 tablet  0  . [DISCONTINUED] metFORMIN (GLUMETZA) 1000 MG (MOD) 24 hr tablet Take 1,000 mg by mouth daily with breakfast.         No current facility-administered medications for this encounter.    Filed Vitals:   09/05/13 0911  BP: 116/68  Pulse: 65  Height: 5\' 8"  (1.727 m)  Weight: 207 lb 12.8 oz (94.257 kg)  SpO2: 98%                                                                    PHYSICAL EXAM: General: Well appearing, NAD HEENT: normal Neck: supple. JVP 6-7. Carotids 2+ bilaterally; no bruits. No lymphadenopathy or thryomegaly appreciated. Cor: PMI normal. Regular rate & rhythm. No rubs, gallops. 2/6 TR Lungs: rhonchi upper lobe Abdomen: soft, nontender, nondistended. No hepatosplenomegaly. No bruits or masses. Good bowel sounds. Extremities: no cyanosis, clubbing, rash, edema.  Lt wrist cast Neuro: alert & orientedx3, cranial nerves grossly intact. Moves all 4 extremities w/o difficulty. Affect pleasant.  EKG: NSR 61 bpm, QRS 116 ms  ASSESSMENT & PLAN:  1) Chronic systolic heart failure: Presumed NICM.  She has NYHA I symptoms and volume status is stable. Her EF is decreased from 50% to 35-40%. ECHO reviewed with patient. She has never had stress testing or a heart catheterization.   - Suspect NICM, but will arrange for Lexiscan stress cardiac MRI to assess for ischemia/infarction, verify EF (in borderline range for ICD if she were symptomatic), and to assess for any infiltrative disease or evidence of prior myocarditis.  - At this time she does not meet criteria for ICD given NYHA class I symptoms.  EF is borderline by echo.  Will reassess EF by MRI, could consider CPX if EF is < 35% for objective assessment of exercise capacity.  - Her QRS is not widened enough for CRT consideration.  - She is on goal dose Coreg 25 mg BID, Spiro 25 mg daily, digoxin 0.25 mg and enalapril 5 mg qam/10 mg qpm. Will increase enalapril to 10 mg BID and check BMET  next week. Will also get digoxin level with labs.  -Reinforced the need and importance of daily  weights, a low sodium diet, and fluid restriction (less than 2 L a day). Instructed to call the HF clinic if weight increases more than 3 lbs overnight or 5 lbs in a week.   2) Obesity - Patient has lost 12 lbs since last visit and is exercising regularly. Encouraged her to keep up the good work.   3) Atrial flutter: Paroxysmal.  In NSR today.  - Continue Eliquis 5 mg BID and Amiodarone 100 mg daily. - Had LFTs and TSH checked by PCP in September 2014 and they were normal. - Will need yearly eye exam while on amiodarone.   4) Hyperlipidemia: On low dose of pravastatin.  Had brother with premature CAD.  Will check lipids.    F/U 6 weeks.  [name] B NP-C 12:13 PM  Patient seen with NP, agree with the above note.  Patient has a presumed nonischemic cardiomyopathy, etiology uncertain.  EF is lower today than the most recent past study.  As above, will arrange for cardiac stress MRI.  Will increase enalapril to 10 mg bid with labs in 2 wks.  If EF is < 35%, would consider CPX to see if the patient is truly asymptomatic.   Marca Ancona 09/05/2013

## 2013-09-05 NOTE — Progress Notes (Signed)
Echocardiogram 2D Echocardiogram has been performed.  Annina Piotrowski 09/05/2013, 10:01 AM

## 2013-09-05 NOTE — Patient Instructions (Addendum)
Increase enalapril to 10 mg daily.  Will call about cardiac MRI  Get labs in 7-10 days at [facility] (old )  F/U 6 weeks.  Do the following things EVERYDAY: 1) Weigh yourself in the morning before breakfast. Write it down and keep it in a log. 2) Take your medicines as prescribed 3) Eat low salt foods-Limit salt (sodium) to 2000 mg per day.  4) Stay as active as you can everyday 5) Limit all fluids for the day to less than 2 liters 6)

## 2013-09-06 NOTE — Addendum Note (Signed)
Encounter addended by: Simon Rhein, CCT on: 09/06/2013  9:05 AM<BR>     Documentation filed: Charges VN

## 2013-09-08 ENCOUNTER — Other Ambulatory Visit: Payer: Self-pay

## 2013-09-08 ENCOUNTER — Other Ambulatory Visit (HOSPITAL_COMMUNITY): Payer: Self-pay | Admitting: Physician Assistant

## 2013-09-08 DIAGNOSIS — Z1231 Encounter for screening mammogram for malignant neoplasm of breast: Secondary | ICD-10-CM

## 2013-09-12 ENCOUNTER — Other Ambulatory Visit (INDEPENDENT_AMBULATORY_CARE_PROVIDER_SITE_OTHER): Payer: 59

## 2013-09-12 ENCOUNTER — Other Ambulatory Visit (HOSPITAL_COMMUNITY): Payer: Self-pay | Admitting: Cardiology

## 2013-09-12 DIAGNOSIS — I5022 Chronic systolic (congestive) heart failure: Secondary | ICD-10-CM

## 2013-09-12 LAB — BASIC METABOLIC PANEL
CO2: 24 mEq/L (ref 19–32)
Chloride: 99 mEq/L (ref 96–112)
Potassium: 3.9 mEq/L (ref 3.5–5.1)
Sodium: 135 mEq/L (ref 135–145)

## 2013-09-12 MED ORDER — FUROSEMIDE 20 MG PO TABS: 20.0000 mg | ORAL_TABLET | ORAL | Status: DC

## 2013-09-12 NOTE — Telephone Encounter (Signed)
Requested Prescriptions   Signed Prescriptions Disp Refills  . furosemide (LASIX) 20 MG tablet 30 tablet 6    Sig: Take 1 tablet (20 mg total) by mouth every other day.    Authorizing Provider: Dolores Patty    Ordering User: Barnaby Rippeon, Milagros Reap

## 2013-09-13 LAB — NMR LIPOPROFILE WITH LIPIDS
Cholesterol, Total: 185 mg/dL (ref <200)
HDL Size: 8.7 nm — ABNORMAL LOW (ref >=9.2)
HDL-C: 46 mg/dL (ref >=40)
LDL (calc): 73 mg/dL (ref <100)
LDL Particle Number: 1188 nmol/L — ABNORMAL HIGH (ref <1000)
LP-IR Score: 74 — ABNORMAL HIGH (ref <=45)
Triglycerides: 329 mg/dL — ABNORMAL HIGH (ref <150)
VLDL Size: 51.2 nm — ABNORMAL HIGH (ref <=46.6)

## 2013-09-13 LAB — DIGOXIN LEVEL: Digoxin Level: 1.5 ng/mL (ref 0.8–2.0)

## 2013-09-15 ENCOUNTER — Telehealth (HOSPITAL_COMMUNITY): Payer: Self-pay | Admitting: Cardiology

## 2013-09-15 DIAGNOSIS — I5022 Chronic systolic (congestive) heart failure: Secondary | ICD-10-CM

## 2013-09-15 DIAGNOSIS — E785 Hyperlipidemia, unspecified: Secondary | ICD-10-CM

## 2013-09-15 MED ORDER — PRAVASTATIN SODIUM 10 MG PO TABS: 40.0000 mg | ORAL_TABLET | Freq: Every day | ORAL | Status: DC

## 2013-09-15 NOTE — Telephone Encounter (Signed)
Message copied by JEFFRIES, Milagros Reap on Fri Sep 15, 2013  3:29 PM ------      Message from: Laurey Morale      Created: Fri Sep 15, 2013  9:56 AM       Given elevated LDL particle number and strong family history of CAD, would increase pravastatin to 40 mg daily with lipids/LFTs in 2 months. ------

## 2013-09-15 NOTE — Telephone Encounter (Signed)
PT AWARE OF LABS RESULTS (DIGOXIN AND LDL) WILL HAVE LABS RECHECKED ON 10/29 AS THAT'S THE NEXT TIME SHE WILL BE IN [place] ORDERS PLACED, APPT MADE, NEW RX SENT TO PHARM

## 2013-09-15 NOTE — Addendum Note (Signed)
Addended by: Pharaoh Pio, Milagros Reap on: 09/15/2013 03:55 PM   Modules accepted: Orders

## 2013-09-20 ENCOUNTER — Ambulatory Visit
Admission: RE | Admit: 2013-09-20 | Discharge: 2013-09-20 | Disposition: A | Discharge status: Home / Self Care | Payer: 59 | Source: Ambulatory Visit

## 2013-09-20 DIAGNOSIS — Z1231 Encounter for screening mammogram for malignant neoplasm of breast: Secondary | ICD-10-CM

## 2013-09-22 ENCOUNTER — Telehealth (HOSPITAL_COMMUNITY): Payer: Self-pay | Admitting: Cardiology

## 2013-09-22 DIAGNOSIS — I5021 Acute systolic (congestive) heart failure: Secondary | ICD-10-CM

## 2013-09-22 NOTE — Telephone Encounter (Signed)
Order placed for cardiac MRi

## 2013-09-27 ENCOUNTER — Other Ambulatory Visit: Payer: 59

## 2013-10-05 ENCOUNTER — Other Ambulatory Visit: Payer: Self-pay

## 2013-10-13 ENCOUNTER — Encounter: Payer: Self-pay | Admitting: Cardiology

## 2013-10-13 ENCOUNTER — Other Ambulatory Visit (HOSPITAL_COMMUNITY): Payer: Self-pay | Admitting: *Deleted

## 2013-10-13 MED ORDER — SPIRONOLACTONE 25 MG PO TABS: 25.0000 mg | ORAL_TABLET | Freq: Every day | ORAL | Status: DC

## 2013-10-16 ENCOUNTER — Other Ambulatory Visit (HOSPITAL_COMMUNITY): Payer: Self-pay | Admitting: Adult Health

## 2013-10-17 ENCOUNTER — Ambulatory Visit (HOSPITAL_COMMUNITY): Admission: RE | Admit: 2013-10-17 | Payer: 59 | Source: Ambulatory Visit

## 2013-10-17 ENCOUNTER — Other Ambulatory Visit: Payer: 59

## 2013-10-17 DIAGNOSIS — I5022 Chronic systolic (congestive) heart failure: Secondary | ICD-10-CM

## 2013-10-19 ENCOUNTER — Encounter (HOSPITAL_COMMUNITY): Payer: 59

## 2013-10-24 ENCOUNTER — Ambulatory Visit (HOSPITAL_COMMUNITY)
Admission: RE | Admit: 2013-10-24 | Discharge: 2013-10-24 | Disposition: A | Discharge status: Home / Self Care | Payer: 59 | Source: Ambulatory Visit | Attending: Cardiology | Admitting: Cardiology

## 2013-10-24 DIAGNOSIS — I059 Rheumatic mitral valve disease, unspecified: Secondary | ICD-10-CM | POA: Insufficient documentation

## 2013-10-24 DIAGNOSIS — R079 Chest pain, unspecified: Secondary | ICD-10-CM | POA: Insufficient documentation

## 2013-10-24 DIAGNOSIS — R9439 Abnormal result of other cardiovascular function study: Secondary | ICD-10-CM | POA: Insufficient documentation

## 2013-10-24 MED ORDER — REGADENOSON 0.4 MG/5ML IV SOLN
INTRAVENOUS | Status: AC
  Filled 2013-10-24: qty 5

## 2013-10-24 MED ORDER — GADOBENATE DIMEGLUMINE 529 MG/ML IV SOLN
30.0000 mL | Freq: Once | INTRAVENOUS | Status: AC
  Administered 2013-10-24: 30 mL via INTRAVENOUS

## 2013-10-31 ENCOUNTER — Encounter (HOSPITAL_COMMUNITY): Payer: Self-pay

## 2013-10-31 ENCOUNTER — Ambulatory Visit (HOSPITAL_COMMUNITY)
Admission: RE | Admit: 2013-10-31 | Discharge: 2013-10-31 | Disposition: A | Discharge status: Home / Self Care | Payer: 59 | Source: Ambulatory Visit | Attending: Internal Medicine | Admitting: Internal Medicine

## 2013-10-31 VITALS — BP 106/66 | HR 71 | Ht 68.0 in | Wt 196.8 lb

## 2013-10-31 DIAGNOSIS — I509 Heart failure, unspecified: Secondary | ICD-10-CM | POA: Insufficient documentation

## 2013-10-31 DIAGNOSIS — I10 Essential (primary) hypertension: Secondary | ICD-10-CM | POA: Insufficient documentation

## 2013-10-31 DIAGNOSIS — E785 Hyperlipidemia, unspecified: Secondary | ICD-10-CM

## 2013-10-31 DIAGNOSIS — E669 Obesity, unspecified: Secondary | ICD-10-CM | POA: Insufficient documentation

## 2013-10-31 DIAGNOSIS — E119 Type 2 diabetes mellitus without complications: Secondary | ICD-10-CM | POA: Insufficient documentation

## 2013-10-31 DIAGNOSIS — E039 Hypothyroidism, unspecified: Secondary | ICD-10-CM | POA: Insufficient documentation

## 2013-10-31 DIAGNOSIS — I059 Rheumatic mitral valve disease, unspecified: Secondary | ICD-10-CM | POA: Insufficient documentation

## 2013-10-31 DIAGNOSIS — I4892 Unspecified atrial flutter: Secondary | ICD-10-CM

## 2013-10-31 DIAGNOSIS — I5022 Chronic systolic (congestive) heart failure: Secondary | ICD-10-CM | POA: Insufficient documentation

## 2013-10-31 DIAGNOSIS — I428 Other cardiomyopathies: Secondary | ICD-10-CM

## 2013-10-31 NOTE — Progress Notes (Signed)
Patient ID: [name], female   DOB: 15-Nov-1961, 52 y.o.   MRN: [id number]  PCP: Dr. 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   She was admitted to [facility] in 01/2012 for acute systolic heart failure, presumed NICM, EF 25% (no cath or stress testing performed).  ProBNP 726.  Started on ACE-I, carvedilol, digoxin, and spironolactone. There was mild MR, mildly dilated LA.  TSH elevated but Free T3 and T4 within normal limits, ANA/HIV/Hepatitis panels normal.   Event monitor: 03/25/12 Frequent PVCs, A-Flutter.  Dr Gala Romney discussed with Dr Graciela Husbands and she started Amiodarone 200 mg bid.  Echos: 04/08/12 EF 30-35% LVIDd 5.6 (previous LVIDd 6.2 EF 25%) 05/23/12 EF 30-35%, grade 1 diastolic dysfunction.   08/08/12 EF 50% with septal/inferior hypokinesis.  Grade 2 diastolic dysfunction.  Mild MR.  09/05/13 EF 35-40% with diffuse hypokinesis with septal bounce, mild LV dilation, RV normal  10/24/13 Lexiscan stress cMRI: EF 52%, nl size and fx of RV;   Follow up: Had Lexiscan stress cMRI which showed EF 52%. Last visit increased enalapril to 10 mg BID, but was in [employer] and got very dizzy and hypotensive so enalapril 5 mg/10 mg. Now on pravastatin 80 mg daily. Doing great! Denies any SOB, orthopnea or edema. Denies CP. No palpitations or lightheadedness since change in enalapril. Walks for exercise.   Labs (9/14): K+ 4.4, creatinine 0.57, ALT 25, AST 22, TSH 1.39, total T4 6.4, A1C 7.8     (08/2013): LDL particle 1188,      (11/14): digoxin 0.5, K+ 3.9, creatinine 0.7  SH: Nurse practitioner now, works at [employer] currently, nonsmoker, rare ETOH.    FH: Brother with MI at age 50  ROS: All systems negative except as listed in HPI, PMH and Problem List.  Past Medical History  Diagnosis Date  . PCOS (polycystic ovarian syndrome)   . Mitral valve prolapse   . Diabetes  mellitus     since 2002, NIDDM on OHAS  . Kidney calculi     lithotripsy 20112 times  . CHF (congestive heart failure)   . Hypertension   . Heart murmur     mvp  . Hypothyroidism   . Anxiety   . Depression   . Dysrhythmia     pvc,hx Aflutter  . LBBB (left bundle branch block)     Current Outpatient Prescriptions  Medication Sig Dispense Refill  . ALPRAZolam (XANAX) 0.25 MG tablet Take 0.25 mg by mouth 3 (three) times daily as needed for anxiety.       Marland Kitchen amiodarone (PACERONE) 100 MG tablet Take 100 mg by mouth daily.      Marland Kitchen apixaban (ELIQUIS) 5 MG TABS tablet Take 1 tablet (5 mg total) by mouth 2 (two) times daily.  60 tablet  6  . carvedilol (COREG) 25 MG tablet TAKE 1 TABLET BY MOUTH 2 TIMES DAILY WITH A MEAL AS DIRECTED  180 tablet  3  . digoxin (LANOXIN) 0.25 MG tablet Take 1 tablet (0.25 mg total) by mouth daily.  30 tablet  6  . Empagliflozin (JARDIANCE) 25 MG TABS Take 25 mg by mouth daily.      . enalapril (VASOTEC) 10 MG tablet Take 1 tablet (10 mg total) by mouth daily.  30 tablet  6  . furosemide (LASIX) 20 MG tablet Take 1 tablet (20 mg total)  by mouth every other day.  30 tablet  6  . levothyroxine (SYNTHROID, LEVOTHROID) 75 MCG tablet Take 75 mcg by mouth daily.      . Multiple Vitamin (MULTIVITAMIN) tablet Take 1 tablet by mouth daily.      . potassium chloride SA (K-DUR,KLOR-CON) 20 MEQ tablet Take 20 mEq by mouth daily.      . pravastatin (PRAVACHOL) 80 MG tablet Take 80 mg by mouth daily.      . rizatriptan (MAXALT) 10 MG tablet Take 10 mg by mouth as needed for migraine. May repeat in 2 hours if needed      . Saxagliptin-Metformin 2.03-999 MG TB24 Take 1 tablet by mouth 2 (two) times daily with a meal.       . sertraline (ZOLOFT) 50 MG tablet Take 50 mg by mouth 2 (two) times daily.       Marland Kitchen spironolactone (ALDACTONE) 25 MG tablet Take 1 tablet (25 mg total) by mouth daily.  90 tablet  3  . [DISCONTINUED] metFORMIN (GLUMETZA) 1000 MG (MOD) 24 hr tablet Take 1,000 mg  by mouth daily with breakfast.         No current facility-administered medications for this encounter.    Filed Vitals:   10/31/13 1129  BP: 106/66  Pulse: 71  Height: 5\' 8"  (1.727 m)  Weight: 196 lb 12.8 oz (89.268 kg)  SpO2: 97%                                                                    PHYSICAL EXAM: General: Well appearing, NAD HEENT: normal Neck: supple. JVP flat. Carotids 2+ bilaterally; no bruits. No lymphadenopathy or thryomegaly appreciated. Cor: PMI normal. Regular rate & rhythm. No rubs, gallops. 2/6 TR Lungs: rhonchi upper lobe Abdomen: soft, nontender, nondistended. No hepatosplenomegaly. No bruits or masses. Good bowel sounds. Extremities: no cyanosis, clubbing, rash, edema.  Lt wrist cast Neuro: alert & orientedx3, cranial nerves grossly intact. Moves all 4 extremities w/o difficulty. Affect pleasant.  ASSESSMENT & PLAN:  1) Chronic systolic heart failure: Presumed NICM; EF 52% (cMRI 09/2013).  She has NYHA I symptoms and volume status is stable. Will continue lasix 20 mg QOD. - She is on goal dose Coreg 25 mg BID. - Continue Spiro 25 mg daily, digoxin 0.25 mg and enalapril 5 qam/10 qpm. Tried to increase enalapril last visit however had hypotension and dizziness. -Reinforced the need and importance of daily weights, a low sodium diet, and fluid restriction (less than 2 L a day). Instructed to call the HF clinic if weight increases more than 3 lbs overnight or 5 lbs in a week.  2) Obesity - Doing great and losing weight. Encouraged her to continue to exercise and watch what she eats. 3) Atrial flutter: Paroxysmal.  In NSR today.  - Continue Eliquis 5 mg BID. - stop amiodarone 100 mg daily. Instructed to get BP cuff at home and if she notices irregular HR to call.  4) Hyperlipidemia: On pravastatin 80 mg daily.    F/U 4 months [name] B NP-C 11:42 AM  Patient seen and examined with Brandi Potash, NP. We discussed all aspects of the encounter. I  agree with the assessment and plan as stated above.   She is doing  great. MRI results reviewed with her and Dr. Delton See. EF now low normal. Perfusion defect likely related to conduction abnormality and not ischemia. Denies any anginal symptoms. Discussed possibility of CT angio but without symptoms will not pursue at this time. Continue medical management. Triglycerides are up. Discussed need for exercise and weight loss. Continue prava per PCP. If needs stronger agent would favor switching to Crestor over adding ezetemibe.  Agree with stopping amio. Continue Eliqiuis.   Daniel Bensimhon,MD 10:26 AM

## 2013-10-31 NOTE — Patient Instructions (Addendum)
Doing great!!!  Have a wonderful Holiday Season.  Get a blood pressure cuff to check for irregular heart rate.  Stop amiodarone.  Call any issues.  Good luck with finding a job.  We will contact you in 3-4 months to schedule your next appointment.  Do the following things EVERYDAY: 1) Weigh yourself in the morning before breakfast. Write it down and keep it in a log. 2) Take your medicines as prescribed 3) Eat low salt foods-Limit salt (sodium) to 2000 mg per day.  4) Stay as active as you can everyday 5) Limit all fluids for the day to less than 2 liters

## 2013-11-01 DIAGNOSIS — I428 Other cardiomyopathies: Secondary | ICD-10-CM | POA: Insufficient documentation

## 2013-11-01 DIAGNOSIS — E785 Hyperlipidemia, unspecified: Secondary | ICD-10-CM | POA: Insufficient documentation

## 2013-11-14 ENCOUNTER — Other Ambulatory Visit (HOSPITAL_COMMUNITY): Payer: Self-pay | Admitting: Internal Medicine

## 2013-12-08 ENCOUNTER — Other Ambulatory Visit (HOSPITAL_COMMUNITY): Payer: Self-pay | Admitting: Surgery

## 2013-12-08 MED ORDER — POTASSIUM CHLORIDE CRYS ER 20 MEQ PO TBCR: 20.0000 meq | EXTENDED_RELEASE_TABLET | Freq: Every day | ORAL | Status: DC

## 2014-03-13 ENCOUNTER — Encounter (HOSPITAL_COMMUNITY): Payer: Self-pay

## 2014-03-15 NOTE — Progress Notes (Signed)
Patient ID: [name], female   DOB: 1961-10-17, 53 y.o.   MRN: [id number]  PCP: Dr. 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   She was admitted to [facility] in 01/2012 for acute systolic heart failure, presumed NICM, EF 25% (no cath or stress testing performed).  ProBNP 726.  Started on ACE-I, carvedilol, digoxin, and spironolactone. There was mild MR, mildly dilated LA.  TSH elevated but Free T3 and T4 within normal limits, ANA/HIV/Hepatitis panels normal.   Event monitor: 03/25/12 Frequent PVCs, A-Flutter.  Dr Gala RomneyBensimhon discussed with Dr Graciela HusbandsKlein and she started Amiodarone 200 mg bid.  She returns for follow up. Overall she feels great. Moving to [place] once her house sells. Denies SOB/PND/Orthopnea. Weight at home 190 pounds. She has lost 60 pounds. Compliant medications.   Echos: 04/08/12 EF 30-35% LVIDd 5.6 (previous LVIDd 6.2 EF 25%) 05/23/12 EF 30-35%, grade 1 diastolic dysfunction.   08/08/12 EF 50% with septal/inferior hypokinesis.  Grade 2 diastolic dysfunction.  Mild MR.  09/05/13 EF 35-40% with diffuse hypokinesis with septal bounce, mild LV dilation, RV normal 10/24/13 Lexiscan stress cMRI: EF 52%, nl size and fx of RV  She returns for follow up. Overall she feels great. Moving to [place] once her house sells. Denies SOB/PND/Orthopnea. Weight at home 190 pounds. She has lost 60 pounds. Compliant medications.   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  Husband died 2 years ago had MI  FH: Brother with MI at age 51  ROS: All systems negative except as listed in HPI, PMH and Problem List.  Past  Medical History  Diagnosis Date  . PCOS (polycystic ovarian syndrome)   . Mitral valve prolapse   . Diabetes mellitus     since 2002, NIDDM on OHAS  . Kidney calculi     lithotripsy 20112 times  . CHF (congestive heart failure)   . Hypertension   . Heart murmur     mvp  . Hypothyroidism   . Anxiety   . Depression   . Dysrhythmia     pvc,hx Aflutter  . LBBB (left bundle branch block)     Current Outpatient Prescriptions  Medication Sig Dispense Refill  . ALPRAZolam (XANAX) 0.25 MG tablet Take 0.25 mg by mouth 3 (three) times daily as needed for anxiety.       Marland Kitchen. apixaban (ELIQUIS) 5 MG TABS tablet Take 1 tablet (5 mg total) by mouth 2 (two) times daily.  60 tablet  6  . carvedilol (COREG) 25 MG tablet TAKE 1 TABLET BY MOUTH 2 TIMES DAILY WITH A MEAL AS DIRECTED  180 tablet  3  . digoxin (LANOXIN) 0.25 MG tablet Take 1 tablet (0.25 mg total) by mouth daily.  30 tablet  6  . Empagliflozin (JARDIANCE) 25 MG TABS Take 25 mg by mouth daily.      . enalapril (VASOTEC) 10 MG tablet Take by mouth daily. 5mg  in am, 10mg  in pm      . furosemide (LASIX) 20 MG tablet Take 1 tablet (20 mg total) by  mouth every other day.  30 tablet  6  . levothyroxine (SYNTHROID, LEVOTHROID) 75 MCG tablet Take 75 mcg by mouth daily.      . Multiple Vitamin (MULTIVITAMIN) tablet Take 1 tablet by mouth daily.      . potassium chloride SA (K-DUR,KLOR-CON) 20 MEQ tablet Take 1 tablet (20 mEq total) by mouth daily.  45 tablet  6  . pravastatin (PRAVACHOL) 80 MG tablet Take 80 mg by mouth daily.      . rizatriptan (MAXALT) 10 MG tablet Take 10 mg by mouth as needed for migraine. May repeat in 2 hours if needed      . Saxagliptin-Metformin 2.03-999 MG TB24 Take 1 tablet by mouth 2 (two) times daily with a meal.       . sertraline (ZOLOFT) 50 MG tablet Take 50 mg by mouth 2 (two) times daily.       Marland Kitchen spironolactone (ALDACTONE) 25 MG tablet Take 1 tablet (25 mg total) by mouth daily.  90 tablet  3  . tolterodine  (DETROL) 2 MG tablet Take 4 mg by mouth every morning.      . [DISCONTINUED] metFORMIN (GLUMETZA) 1000 MG (MOD) 24 hr tablet Take 1,000 mg by mouth daily with breakfast.         No current facility-administered medications for this encounter.    Filed Vitals:   03/16/14 1107  BP: 104/59  Pulse: 69  Resp: 18  Weight: 195 lb (88.451 kg)  SpO2: 99%                                                                    PHYSICAL EXAM: General: Well appearing, NAD HEENT: normal Neck: supple. JVP flat. Carotids 2+ bilaterally; no bruits. No lymphadenopathy or thryomegaly appreciated. Cor: PMI normal. Regular rate & rhythm. No rubs, gallops. 2/6 TR Lungs: Clear Abdomen: soft, nontender, nondistended. No hepatosplenomegaly. No bruits or masses. Good bowel sounds. Extremities: no cyanosis, clubbing, rash, edema.   Neuro: alert & orientedx3, cranial nerves grossly intact. Moves all 4 extremities w/o difficulty. Affect pleasant.  ASSESSMENT & PLAN:  1) Chronic systolic heart failure: Presumed NICM; EF 52% (cMRI 09/2013).  She has NYHA I symptoms. Volume status stable. Switch lasix to prn and continue 25 mg spironolactone daily.  - Continue Coreg 25 mg BID. Stop dig as EF is normal.  - Continue enalapril 5 qam/10 qpm Check BMET---> K 4.3 Creatinine 0.70 -Reinforced the need and importance of daily weights, a low sodium diet, and fluid restriction (less than 2 L a day). Instructed to call the HF clinic if weight increases more than 3 lbs overnight or 5 lbs in a week.  2) Obesity. She has lost 60 pounds. Encouraged to continue exercising.  -3) Atrial flutter: Paroxysmal.  Regular rate rhythm. Continue Eliquis 5 mg BID. -4) Hyperlipidemia: On pravastatin 80 mg daily.    Follow up as needed  Kyler Germer D Sipriano Fendley NP-C 11:12 AM

## 2014-03-16 ENCOUNTER — Telehealth (HOSPITAL_COMMUNITY): Payer: Self-pay | Admitting: Adult Health

## 2014-03-16 ENCOUNTER — Encounter (HOSPITAL_COMMUNITY): Payer: Self-pay

## 2014-03-16 ENCOUNTER — Ambulatory Visit (HOSPITAL_COMMUNITY)
Admission: RE | Admit: 2014-03-16 | Discharge: 2014-03-16 | Disposition: A | Discharge status: Home / Self Care | Payer: 59 | Source: Ambulatory Visit | Attending: Cardiology | Admitting: Cardiology

## 2014-03-16 VITALS — BP 104/59 | HR 69 | Resp 18 | Wt 195.0 lb

## 2014-03-16 DIAGNOSIS — E669 Obesity, unspecified: Secondary | ICD-10-CM | POA: Insufficient documentation

## 2014-03-16 DIAGNOSIS — I5022 Chronic systolic (congestive) heart failure: Secondary | ICD-10-CM | POA: Insufficient documentation

## 2014-03-16 DIAGNOSIS — I428 Other cardiomyopathies: Secondary | ICD-10-CM | POA: Insufficient documentation

## 2014-03-16 LAB — BASIC METABOLIC PANEL WITH GFR
BUN: 16 mg/dL (ref 6–23)
CO2: 27 meq/L (ref 19–32)
Calcium: 9.8 mg/dL (ref 8.4–10.5)
Chloride: 99 meq/L (ref 96–112)
Creatinine, Ser: 0.7 mg/dL (ref 0.50–1.10)
GFR calc Af Amer: >90 mL/min (ref >90)
GFR calc non Af Amer: >90 mL/min (ref >90)
Glucose, Bld: 107 mg/dL — ABNORMAL HIGH (ref 70–99)
Potassium: 4.3 meq/L (ref 3.7–5.3)
Sodium: 139 meq/L (ref 137–147)

## 2014-03-16 MED ORDER — FUROSEMIDE 20 MG PO TABS: 20.0000 mg | ORAL_TABLET | ORAL | Status: DC | PRN

## 2014-03-16 NOTE — Telephone Encounter (Signed)
Provided with lab results.   Renal function stable . Can stop potassium   Ms Tier verbalized understanding.   Jeslin Bazinet D Kymoni Lesperance NP-C 12:48 PM

## 2014-03-16 NOTE — Patient Instructions (Signed)
Follow up as needed  Stop digoxin   Take lasix as needed

## 2014-03-28 ENCOUNTER — Encounter (HOSPITAL_COMMUNITY): Payer: 59

## 2014-04-09 ENCOUNTER — Other Ambulatory Visit (HOSPITAL_COMMUNITY): Payer: Self-pay

## 2014-04-09 MED ORDER — APIXABAN 5 MG PO TABS: 5.0000 mg | ORAL_TABLET | Freq: Two times a day (BID) | ORAL | Status: DC

## 2014-05-18 ENCOUNTER — Telehealth (HOSPITAL_COMMUNITY): Payer: Self-pay | Admitting: Vascular Surgery

## 2014-05-18 NOTE — Telephone Encounter (Signed)
Pharmacist Larita Fife from [facility] called pt need refill on medication Eliquis and Enalapril

## 2014-05-22 ENCOUNTER — Other Ambulatory Visit (HOSPITAL_COMMUNITY): Payer: Self-pay

## 2014-05-22 MED ORDER — ENALAPRIL MALEATE 10 MG PO TABS: ORAL_TABLET | ORAL | Status: DC

## 2014-05-22 MED ORDER — APIXABAN 5 MG PO TABS: 5.0000 mg | ORAL_TABLET | Freq: Two times a day (BID) | ORAL | Status: DC

## 2014-05-22 NOTE — Telephone Encounter (Signed)
Refills sent to preferred pharmacy [facility] not found in system, phone number listed was incorrect)

## 2014-09-04 ENCOUNTER — Telehealth (HOSPITAL_COMMUNITY): Payer: Self-pay | Admitting: Vascular Surgery

## 2014-09-04 DIAGNOSIS — I5022 Chronic systolic (congestive) heart failure: Secondary | ICD-10-CM

## 2014-09-04 NOTE — Telephone Encounter (Signed)
Pt called she moved to [place] .Marland Kitchen She would like to talk to someone about her medications... She would like a month supply of medication until she establish care in [place]. Please advise

## 2014-09-04 NOTE — Telephone Encounter (Signed)
Spoke w/pt she has now moved to [place] and Dr Gala Romney was suppose to give her a name of an MD there that he knew, also she states her Carvedilol is on a national back order and she has been out for 2 weeks, she also states she stopped her Enalapril due to low BP and her pravastatin due to memory issues.  Will let Dr Gala Romney know and find out who he would recommend for her to follow with in [place] and what dose of carvedilol she should restart

## 2014-09-05 NOTE — Telephone Encounter (Signed)
What part of [place]? Can her PCP recommend a local heart doc. If not, we can try to help.

## 2014-09-13 MED ORDER — APIXABAN 5 MG PO TABS: 5.0000 mg | ORAL_TABLET | Freq: Two times a day (BID) | ORAL | Status: AC

## 2014-09-13 MED ORDER — FUROSEMIDE 20 MG PO TABS: 20.0000 mg | ORAL_TABLET | ORAL | Status: AC | PRN

## 2014-09-13 MED ORDER — SPIRONOLACTONE 25 MG PO TABS: 25.0000 mg | ORAL_TABLET | Freq: Every day | ORAL | Status: AC

## 2014-09-13 MED ORDER — CARVEDILOL 6.25 MG PO TABS: 6.2500 mg | ORAL_TABLET | Freq: Two times a day (BID) | ORAL | Status: AC

## 2014-09-13 NOTE — Telephone Encounter (Signed)
Per Dr Gala Romney pt should restart carvedilol at only 6.25 mg Twice daily, new rx and refills for other meds sent into pt's pharmacy, Dr Gala Romney recommends Purvis Sheffield who is head of heart failure and transplant clinic at [facility], pt was provided with info
